# Patient Record
Sex: Male | Born: 1960
Health system: Southern US, Community
[De-identification: ages and names within clinical notes are randomized; demographics above are authoritative.]

## PROBLEM LIST (undated history)

## (undated) DIAGNOSIS — R51 Headache: Secondary | ICD-10-CM

## (undated) DIAGNOSIS — R079 Chest pain, unspecified: Secondary | ICD-10-CM

## (undated) DIAGNOSIS — R519 Headache, unspecified: Secondary | ICD-10-CM

## (undated) DIAGNOSIS — E785 Hyperlipidemia, unspecified: Secondary | ICD-10-CM

## (undated) DIAGNOSIS — D333 Benign neoplasm of cranial nerves: Secondary | ICD-10-CM

## (undated) DIAGNOSIS — N200 Calculus of kidney: Secondary | ICD-10-CM

## (undated) DIAGNOSIS — I82409 Acute embolism and thrombosis of unspecified deep veins of unspecified lower extremity: Secondary | ICD-10-CM

## (undated) DIAGNOSIS — I10 Essential (primary) hypertension: Secondary | ICD-10-CM

## (undated) HISTORY — DX: Headache, unspecified: R51.9

## (undated) HISTORY — DX: Chest pain, unspecified: R07.9

## (undated) HISTORY — DX: Essential (primary) hypertension: I10

## (undated) HISTORY — DX: Headache: R51

## (undated) HISTORY — DX: Hyperlipidemia, unspecified: E78.5

## (undated) HISTORY — DX: Calculus of kidney: N20.0

## (undated) HISTORY — DX: Benign neoplasm of cranial nerves: D33.3

---
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
## 1979-12-06 HISTORY — PX: CYSTOSCOPY: SHX5120

## 2000-03-02 ENCOUNTER — Encounter: Payer: Self-pay | Admitting: *Deleted

## 2000-03-02 ENCOUNTER — Encounter: Admission: RE | Admit: 2000-03-02 | Discharge: 2000-03-02 | Payer: Self-pay | Admitting: *Deleted

## 2002-12-05 HISTORY — PX: KNEE SURGERY: SHX244

## 2008-12-05 HISTORY — PX: COLONOSCOPY: SHX174

## 2009-03-05 HISTORY — PX: WRIST FRACTURE SURGERY: SHX121

## 2013-06-18 ENCOUNTER — Telehealth: Payer: Self-pay | Admitting: Cardiology

## 2013-06-18 NOTE — Telephone Encounter (Signed)
ROI faxed to [facility] at [facility] Ar [phone] 06/18/13/km

## 2013-07-15 ENCOUNTER — Telehealth: Payer: Self-pay | Admitting: Cardiology

## 2013-07-15 NOTE — Telephone Encounter (Signed)
ROI Refaxed to [facility] @ [place] [phone] 07/15/13/KM

## 2013-07-16 ENCOUNTER — Telehealth: Payer: Self-pay | Admitting: Cardiology

## 2013-07-16 NOTE — Telephone Encounter (Signed)
Records Were mailed in From [facility], Scheduling Dept Verified they have Records 07/16/13/KM

## 2013-07-17 ENCOUNTER — Encounter: Payer: Self-pay | Admitting: Cardiology

## 2013-07-17 ENCOUNTER — Ambulatory Visit (INDEPENDENT_AMBULATORY_CARE_PROVIDER_SITE_OTHER): Payer: Commercial Managed Care - PPO | Admitting: Cardiology

## 2013-07-17 VITALS — BP 118/68 | HR 55 | Ht 70.0 in | Wt 172.8 lb

## 2013-07-17 DIAGNOSIS — I10 Essential (primary) hypertension: Secondary | ICD-10-CM

## 2013-07-17 DIAGNOSIS — Z8249 Family history of ischemic heart disease and other diseases of the circulatory system: Secondary | ICD-10-CM

## 2013-07-17 DIAGNOSIS — E7800 Pure hypercholesterolemia, unspecified: Secondary | ICD-10-CM

## 2013-07-17 MED ORDER — ATORVASTATIN CALCIUM 10 MG PO TABS: 5.0000 mg | ORAL_TABLET | Freq: Every day | ORAL | Status: DC

## 2013-07-17 NOTE — Patient Instructions (Addendum)
Start lipitor 5 mg daily.  I recommend you have repeat lab work in 3 months.  We will schedule you for a stress test

## 2013-07-17 NOTE — Progress Notes (Signed)
Daniel Foster Date of Birth: 1961-11-20 Medical Record #[id number]  History of Present Illness: [name] is seen for evaluation today. 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 He has a history of hypertension and hypercholesterolemia. He also reports a family history of heart disease with his father having 2 heart attacks in his early 62s. He is just concerned about his family history and wants to be reevaluated. I saw [name] in 2002 and we performed a stress test at that time. He was able to exercise for 14 minutes on the Bruce protocol and had a normal stress test. He remains very active. He walks about 5 miles per day. He also bikes about 50-100 miles per week. He occasionally works out in [employer] He has had some minor chest pains that he attributes to costochondritis. It is sore to touch his chest and usually resolves within 2-3 days by taking a nonsteroidal drug. He admits that this past year he was not eating well and gaining 20 pounds. He has now lost that weight. He denies any dyspnea or palpitations.  No current outpatient prescriptions on file prior to visit.   No current facility-administered medications on file prior to visit.    No Known Allergies  Past Medical History  Diagnosis Date  . Chest pain   . Hypertension   . Headache   . Kidney stone     Passed X2 (last one 1981)  . Hyperlipidemia     Past Surgical History  Procedure Laterality Date  . Wrist fracture surgery  April 2010  . Tonsillectomy  1966  . Cystoscopy  1981  . Knee surgery  2004    Arthroscopic surgery Left knee for torn Meniscus    History  Smoking status  . Never Smoker   Smokeless tobacco  . Not on file    History  Alcohol Use: Not on file    Family History  Problem Relation Age of Onset  . Heart attack Father     Review of Systems: As noted in history of present illness.  All other systems were reviewed and are negative.  Physical Exam: BP 118/68  Pulse 55  Ht 5\' 10"  (1.778 m)  Wt 172  lb 12.8 oz (78.382 kg)  BMI 24.79 kg/m2 He is a pleasant white male in no acute distress. HEENT: Normal. Pupils equal round and reactive. Sclera clear. Oropharynx is clear. Neck is without JVD, adenopathy, thyromegaly, or bruits. Lungs: Clear Cardiovascular: Regular rate and rhythm, normal S1 and S2. No gallop or murmur. PMI is normal. No chest wall tenderness to palpation. Abdomen: Soft and nontender. No masses or hepatosplenomegaly. No bruits. Extremities: No edema. Pedal pulses are good. Neuro: Alert and oriented x3. Cranial nerves II through XII are intact. LABORATORY DATA: Laboratory data from his primary care was reviewed. LDL cholesterol has ranged from 119-141. ECG today demonstrates normal sinus rhythm rate 55 beats per minute. It is a normal ECG.  Assessment / Plan: 1. Hypertension-controlled 2. Hypercholesterolemia 3. Family history of coronary disease  Plan: The patient desires followup stress testing to assess his cardiac risk. We'll schedule him for a routine stress test. As far as risk factor modification his blood pressure is under good control. I recommended trying to get his LDL cholesterol less than 086. He has done well with dietary modification. I recommended a low dose of statin to try and achieve his goal. We'll start him on Lipitor 5 mg daily. I recommended he have repeat lab work in 3 months.

## 2013-07-18 ENCOUNTER — Telehealth: Payer: Self-pay | Admitting: Cardiology

## 2013-07-18 DIAGNOSIS — E7800 Pure hypercholesterolemia, unspecified: Secondary | ICD-10-CM

## 2013-07-18 NOTE — Telephone Encounter (Signed)
Patient called no answer.LMTC. 

## 2013-07-18 NOTE — Telephone Encounter (Signed)
New Problem    Pt wants to speak to someone in regards to the care plan suggestion.Marland Kitchen

## 2013-07-19 NOTE — Telephone Encounter (Signed)
Ok to get  Baseline lipid panel.  Peter Swaziland MD, [facility]

## 2013-07-19 NOTE — Telephone Encounter (Signed)
Spoke with patient who states he called yesterday to ask if Dr. Swaziland could order a lipid panel since he has been advised to start Lipitor 5 mg QD and previous lipid panel was 11/13.  Patient states after his ov he thought about the fact that that lipid panel was performed when he was at his heaviest weight and he would like a more recent baseline so that when he gets lipids reevaluated in 3 months they will have a better understanding of how well the Lipitor is working.  Patient states the first available date he is available for labs is 8/27 as he will be out of town until that time.  Patient would also like to ask that prescriptions be ordered for one year's supply so when Lipitor Rx is reordered to please order 365 pills (patient understands that there is additional cost associated with this). I advised patient that Dr. Swaziland is not in the office today but that I will send him a message asking if I can order the lab work.  Patient verbalized understanding and gratitude. Dr. Swaziland, can I order a fasting lipid panel for 8/27?

## 2013-07-19 NOTE — Telephone Encounter (Signed)
New Problem  Pt rtned call// upset at how late the call was made// insisted to to speak with a nurse instead of sending a msg/// Routed a call to Triage.

## 2013-07-22 NOTE — Telephone Encounter (Signed)
Message left on pt's identified voicemail that fasting lipid profile would be ordered for July 31, 2013 and that lab opens at 7:30 AM. Left message to call back if questions.

## 2013-07-22 NOTE — Addendum Note (Signed)
Addended byMeryl Crutch, Anita Mcadory L on: 07/22/2013 10:00 AM   Modules accepted: Orders

## 2013-07-31 ENCOUNTER — Other Ambulatory Visit (INDEPENDENT_AMBULATORY_CARE_PROVIDER_SITE_OTHER): Payer: Commercial Managed Care - PPO

## 2013-07-31 DIAGNOSIS — E7800 Pure hypercholesterolemia, unspecified: Secondary | ICD-10-CM

## 2013-07-31 LAB — LIPID PANEL
Cholesterol: 184 mg/dL (ref 0–200)
LDL Cholesterol: 128 mg/dL — ABNORMAL HIGH (ref 0–99)
Total CHOL/HDL Ratio: 5
VLDL: 19.6 mg/dL (ref 0.0–40.0)

## 2013-08-22 ENCOUNTER — Encounter: Payer: Self-pay | Admitting: Cardiology

## 2013-08-22 ENCOUNTER — Other Ambulatory Visit: Payer: Self-pay | Admitting: *Deleted

## 2013-08-22 ENCOUNTER — Ambulatory Visit (INDEPENDENT_AMBULATORY_CARE_PROVIDER_SITE_OTHER): Payer: Commercial Managed Care - PPO | Admitting: Physician Assistant

## 2013-08-22 DIAGNOSIS — Z8249 Family history of ischemic heart disease and other diseases of the circulatory system: Secondary | ICD-10-CM

## 2013-08-22 DIAGNOSIS — R079 Chest pain, unspecified: Secondary | ICD-10-CM

## 2013-08-22 NOTE — Progress Notes (Signed)
Exercise Treadmill Test  Pre-Exercise Testing Evaluation Rhythm: sinus bradycardia  Rate: 56     Test  Exercise Tolerance Test Ordering MD: Peter Swaziland, MD  Interpreting MD: Tereso Newcomer, PA-C  Unique Test No: 1  Treadmill:  1  Indication for ETT: chest discomfort, Family history of premature CAD  Contraindication to ETT: No   Stress Modality: exercise - treadmill  Cardiac Imaging Performed: non   Protocol: standard Bruce - maximal  Max BP:  199/70  Max MPHR (bpm):  166 85% MPR (bpm):  141  MPHR obtained (bpm):  169 % MPHR obtained:  102  Reached 85% MPHR (min:sec):  12:28 Total Exercise Time (min-sec):  15:00  Workload in METS:  17.2 Borg Scale: 17  Reason ETT Terminated:  desired heart rate attained    ST Segment Analysis At Rest: normal ST segments - no evidence of significant ST depression With Exercise: non-specific ST changes  Other Information Arrhythmia:  No Angina during ETT:  absent (0) Quality of ETT:  diagnostic  ETT Interpretation:  normal - no evidence of ischemia by ST analysis  Comments: Excellent exercise tolerance. No chest pain. Normal BP response to exercise. There is up-sloping ST depression.  No significant ST-T changes to suggest ischemia.  Recommendations: F/u with Dr. Peter Swaziland as directed. Signed,  Tereso Newcomer, PA-C   08/22/2013 9:52 AM

## 2013-10-10 ENCOUNTER — Other Ambulatory Visit: Payer: Self-pay

## 2013-10-17 ENCOUNTER — Other Ambulatory Visit: Payer: Commercial Managed Care - PPO

## 2014-09-19 ENCOUNTER — Other Ambulatory Visit: Payer: Self-pay

## 2016-06-13 ENCOUNTER — Telehealth: Payer: Self-pay | Admitting: Family Medicine

## 2016-06-13 NOTE — Telephone Encounter (Signed)
Pt's wife is an oncologist at [facility] and would like to know if you will accept him as a pt. Wife recommended you. Advised pt you were not accepting, but would ask. Pt does not mind seeing Tommi Rumps if you are too full.

## 2016-06-20 NOTE — Telephone Encounter (Signed)
I would be happy to see him thanks

## 2016-06-29 NOTE — Telephone Encounter (Signed)
Pt will be out of his bp med in the next few weeks, and would like to know if you could see him sooner than first available. Ok to work in?

## 2016-06-30 NOTE — Telephone Encounter (Signed)
Yes he can be worked in any time

## 2016-07-12 ENCOUNTER — Ambulatory Visit: Payer: Commercial Managed Care - PPO | Admitting: Adult Health

## 2016-07-12 ENCOUNTER — Ambulatory Visit (INDEPENDENT_AMBULATORY_CARE_PROVIDER_SITE_OTHER): Payer: Commercial Managed Care - PPO | Admitting: Family Medicine

## 2016-07-12 VITALS — BP 111/62 | HR 52 | Temp 98.2°F | Ht 69.5 in | Wt 177.0 lb

## 2016-07-12 DIAGNOSIS — E7800 Pure hypercholesterolemia, unspecified: Secondary | ICD-10-CM | POA: Diagnosis not present

## 2016-07-12 DIAGNOSIS — N401 Enlarged prostate with lower urinary tract symptoms: Secondary | ICD-10-CM | POA: Diagnosis not present

## 2016-07-12 DIAGNOSIS — Z8249 Family history of ischemic heart disease and other diseases of the circulatory system: Secondary | ICD-10-CM | POA: Diagnosis not present

## 2016-07-12 DIAGNOSIS — I10 Essential (primary) hypertension: Secondary | ICD-10-CM | POA: Diagnosis not present

## 2016-07-12 DIAGNOSIS — N138 Other obstructive and reflux uropathy: Secondary | ICD-10-CM

## 2016-07-12 LAB — CBC WITH DIFFERENTIAL/PLATELET
BASOS PCT: 0.4 % (ref 0.0–3.0)
Basophils Absolute: 0 10*3/uL (ref 0.0–0.1)
EOS ABS: 0.1 10*3/uL (ref 0.0–0.7)
EOS PCT: 1.2 % (ref 0.0–5.0)
HEMATOCRIT: 42.7 % (ref 39.0–52.0)
HEMOGLOBIN: 14.2 g/dL (ref 13.0–17.0)
LYMPHS PCT: 21.7 % (ref 12.0–46.0)
Lymphs Abs: 1.2 10*3/uL (ref 0.7–4.0)
MCHC: 33.3 g/dL (ref 30.0–36.0)
MCV: 80.9 fl (ref 78.0–100.0)
MONO ABS: 0.6 10*3/uL (ref 0.1–1.0)
Monocytes Relative: 11.2 % (ref 3.0–12.0)
NEUTROS ABS: 3.5 10*3/uL (ref 1.4–7.7)
Neutrophils Relative %: 65.5 % (ref 43.0–77.0)
PLATELETS: 201 10*3/uL (ref 150.0–400.0)
RBC: 5.28 Mil/uL (ref 4.22–5.81)
RDW: 16.1 % — AB (ref 11.5–15.5)
WBC: 5.4 10*3/uL (ref 4.0–10.5)

## 2016-07-12 LAB — POC URINALSYSI DIPSTICK (AUTOMATED)
BILIRUBIN UA: NEGATIVE
GLUCOSE UA: NEGATIVE
Ketones, UA: NEGATIVE
Leukocytes, UA: NEGATIVE
Nitrite, UA: NEGATIVE
Protein, UA: NEGATIVE
RBC UA: NEGATIVE
SPEC GRAV UA: 1.015
Urobilinogen, UA: 0.2
pH, UA: 7

## 2016-07-12 LAB — BASIC METABOLIC PANEL
BUN: 11 mg/dL (ref 6–23)
CALCIUM: 9.2 mg/dL (ref 8.4–10.5)
CHLORIDE: 104 meq/L (ref 96–112)
CO2: 29 meq/L (ref 19–32)
Creatinine, Ser: 1.11 mg/dL (ref 0.40–1.50)
GFR: 72.57 mL/min (ref >60.00)
Glucose, Bld: 82 mg/dL (ref 70–99)
Potassium: 4.6 mEq/L (ref 3.5–5.1)
SODIUM: 140 meq/L (ref 135–145)

## 2016-07-12 LAB — HEPATIC FUNCTION PANEL
ALBUMIN: 4.2 g/dL (ref 3.5–5.2)
ALK PHOS: 92 U/L (ref 39–117)
ALT: 17 U/L (ref 0–53)
AST: 24 U/L (ref 0–37)
BILIRUBIN DIRECT: 0.2 mg/dL (ref 0.0–0.3)
TOTAL PROTEIN: 6.5 g/dL (ref 6.0–8.3)
Total Bilirubin: 1.1 mg/dL (ref 0.2–1.2)

## 2016-07-12 LAB — LIPID PANEL
Cholesterol: 184 mg/dL (ref 0–200)
HDL: 38.7 mg/dL — AB (ref >39.00)
NONHDL: 145.6
Total CHOL/HDL Ratio: 5
Triglycerides: 212 mg/dL — ABNORMAL HIGH (ref 0.0–149.0)
VLDL: 42.4 mg/dL — ABNORMAL HIGH (ref 0.0–40.0)

## 2016-07-12 LAB — LDL CHOLESTEROL, DIRECT: Direct LDL: 117 mg/dL

## 2016-07-12 LAB — TSH: TSH: 1.73 u[IU]/mL (ref 0.35–4.50)

## 2016-07-12 LAB — PSA: PSA: 1.43 ng/mL (ref 0.10–4.00)

## 2016-07-12 MED ORDER — ATENOLOL 50 MG PO TABS: 50.0000 mg | ORAL_TABLET | Freq: Every day | ORAL | 0 refills | Status: DC

## 2016-07-12 NOTE — Progress Notes (Signed)
Pre visit review using our clinic review tool, if applicable. No additional management support is needed unless otherwise documented below in the visit note. 

## 2016-07-13 ENCOUNTER — Encounter: Payer: Self-pay | Admitting: Family Medicine

## 2016-07-13 NOTE — Progress Notes (Signed)
   Subjective:    Patient ID: Daniel Foster, male    DOB: 10/14/1961, 55 y.o.   MRN: [id number]  HPI 55 yr old male to establish with [place] after transfering from the care of Dr. Briscoe Deutscher. He feels well and has no concerns. He sees Dr. Peter Martinique regularly for HTN and elevated lipids, and these have been well controlled. 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    Review of Systems  Constitutional: Negative.   Respiratory: Negative.   Cardiovascular: Negative.   Gastrointestinal: Negative.   Neurological: Negative.        Objective:   Physical Exam  Constitutional: He is oriented to person, place, and time. He appears well-developed and well-nourished.  Neck: No thyromegaly present.  Cardiovascular: Normal rate, regular rhythm, normal heart sounds and intact distal pulses.   Pulmonary/Chest: Effort normal and breath sounds normal.  Lymphadenopathy:    He has no cervical adenopathy.  Neurological: He is alert and oriented to person, place, and time.          Assessment & Plan:  His HTN and hyperlipidemia are well controlled. We will locate his past medical records.  Laurey Morale, MD

## 2016-09-16 ENCOUNTER — Encounter: Payer: Self-pay | Admitting: Internal Medicine

## 2016-09-16 ENCOUNTER — Ambulatory Visit (INDEPENDENT_AMBULATORY_CARE_PROVIDER_SITE_OTHER): Payer: Commercial Managed Care - PPO | Admitting: Internal Medicine

## 2016-09-16 ENCOUNTER — Encounter (HOSPITAL_COMMUNITY): Payer: Self-pay | Admitting: Emergency Medicine

## 2016-09-16 ENCOUNTER — Emergency Department (HOSPITAL_COMMUNITY)
Admission: EM | Admit: 2016-09-16 | Discharge: 2016-09-16 | Disposition: A | Discharge status: Home / Self Care | Payer: Commercial Managed Care - PPO | Attending: Emergency Medicine | Admitting: Emergency Medicine

## 2016-09-16 ENCOUNTER — Ambulatory Visit (HOSPITAL_BASED_OUTPATIENT_CLINIC_OR_DEPARTMENT_OTHER)
Admission: RE | Admit: 2016-09-16 | Discharge: 2016-09-16 | Disposition: A | Discharge status: Home / Self Care | Payer: Commercial Managed Care - PPO | Source: Ambulatory Visit | Attending: Internal Medicine | Admitting: Internal Medicine

## 2016-09-16 VITALS — BP 130/72 | HR 58 | Temp 98.9°F | Wt 178.8 lb

## 2016-09-16 DIAGNOSIS — M79661 Pain in right lower leg: Secondary | ICD-10-CM

## 2016-09-16 DIAGNOSIS — M7989 Other specified soft tissue disorders: Secondary | ICD-10-CM

## 2016-09-16 DIAGNOSIS — I82411 Acute embolism and thrombosis of right femoral vein: Secondary | ICD-10-CM | POA: Insufficient documentation

## 2016-09-16 DIAGNOSIS — Z7982 Long term (current) use of aspirin: Secondary | ICD-10-CM | POA: Insufficient documentation

## 2016-09-16 DIAGNOSIS — Z7901 Long term (current) use of anticoagulants: Secondary | ICD-10-CM | POA: Diagnosis not present

## 2016-09-16 DIAGNOSIS — I10 Essential (primary) hypertension: Secondary | ICD-10-CM | POA: Insufficient documentation

## 2016-09-16 DIAGNOSIS — I82401 Acute embolism and thrombosis of unspecified deep veins of right lower extremity: Secondary | ICD-10-CM | POA: Diagnosis not present

## 2016-09-16 HISTORY — DX: Acute embolism and thrombosis of unspecified deep veins of unspecified lower extremity: I82.409

## 2016-09-16 LAB — CBC WITH DIFFERENTIAL/PLATELET
BASOS ABS: 0 10*3/uL (ref 0.0–0.1)
BASOS PCT: 0 %
EOS PCT: 2 %
Eosinophils Absolute: 0.2 10*3/uL (ref 0.0–0.7)
HCT: 41.7 % (ref 39.0–52.0)
Hemoglobin: 14 g/dL (ref 13.0–17.0)
LYMPHS PCT: 10 %
Lymphs Abs: 1 10*3/uL (ref 0.7–4.0)
MCH: 28.2 pg (ref 26.0–34.0)
MCHC: 33.6 g/dL (ref 30.0–36.0)
MCV: 84.1 fL (ref 78.0–100.0)
MONO ABS: 1.1 10*3/uL — AB (ref 0.1–1.0)
Monocytes Relative: 10 %
Neutro Abs: 8 10*3/uL — ABNORMAL HIGH (ref 1.7–7.7)
Neutrophils Relative %: 78 %
PLATELETS: 175 10*3/uL (ref 150–400)
RBC: 4.96 MIL/uL (ref 4.22–5.81)
RDW: 14.3 % (ref 11.5–15.5)
WBC: 10.3 10*3/uL (ref 4.0–10.5)

## 2016-09-16 LAB — COMPREHENSIVE METABOLIC PANEL
ALK PHOS: 93 U/L (ref 38–126)
ALT: 22 U/L (ref 17–63)
ANION GAP: 7 (ref 5–15)
AST: 26 U/L (ref 15–41)
Albumin: 4.1 g/dL (ref 3.5–5.0)
BILIRUBIN TOTAL: 1.2 mg/dL (ref 0.3–1.2)
BUN: 14 mg/dL (ref 6–20)
CALCIUM: 9.1 mg/dL (ref 8.9–10.3)
CO2: 27 mmol/L (ref 22–32)
Chloride: 107 mmol/L (ref 101–111)
Creatinine, Ser: 1.03 mg/dL (ref 0.61–1.24)
GFR calc Af Amer: >60 mL/min (ref >60)
Glucose, Bld: 86 mg/dL (ref 65–99)
POTASSIUM: 4 mmol/L (ref 3.5–5.1)
Sodium: 141 mmol/L (ref 135–145)
TOTAL PROTEIN: 7.5 g/dL (ref 6.5–8.1)

## 2016-09-16 LAB — ANTITHROMBIN III: ANTITHROMB III FUNC: 106 % (ref 75–120)

## 2016-09-16 LAB — PROTIME-INR
INR: 1.05
PROTHROMBIN TIME: 13.7 s (ref 11.4–15.2)

## 2016-09-16 MED ORDER — RIVAROXABAN 15 MG PO TABS
15.0000 mg | ORAL_TABLET | Freq: Once | ORAL | Status: AC
  Administered 2016-09-16: 15 mg via ORAL
  Filled 2016-09-16: qty 1

## 2016-09-16 MED ORDER — RIVAROXABAN (XARELTO) VTE STARTER PACK (15 & 20 MG): ORAL_TABLET | ORAL | 0 refills | Status: DC

## 2016-09-16 NOTE — Progress Notes (Signed)
Chief Complaint  Patient presents with  . Rt Calf Swelling and Pain    HPI: Daniel Foster 55 y.o. comes in PCP not available. Onset this week of right leg discomfort calf intermittent swelling and pain with cramping. No history of blood clots did have travel to [place] last month. No bleeding no acute knee pain. Generally healthy on antihypertensive medication. He is physically active. No history of injury to the right leg.  ROS: See pertinent positives and negatives per HPI. No cp sob   Past Medical History:  Diagnosis Date  . Chest pain   . DVT (deep venous thrombosis) ([place])   . Headache   . Hyperlipidemia   . Hypertension   . Kidney stone    Passed X2 (last one 44)    Family History  Problem Relation Age of Onset  . Heart attack Father     Social History   Social History  . Marital status: Married    Spouse name: N/A  . Number of children: N/A  . Years of education: N/A   Social History Main Topics  . Smoking status: Never Smoker  . Smokeless tobacco: Never Used  . Alcohol use None  . Drug use: Unknown  . Sexual activity: Not Asked   Other Topics Concern  . None   Social History Narrative  . None    Outpatient Medications Prior to Visit  Medication Sig Dispense Refill  . atenolol (TENORMIN) 50 MG tablet Take 1 tablet (50 mg total) by mouth daily. 365 tablet 0  . Multiple Vitamin (MULTIVITAMIN) tablet Take 1 tablet by mouth daily.     No facility-administered medications prior to visit.      EXAM:  BP 130/72 (BP Location: Right Arm, Patient Position: Sitting, Cuff Size: Normal)   Pulse (!) 58   Temp 98.9 F (37.2 C) (Oral)   Wt 178 lb 12.8 oz (81.1 kg)   SpO2 97%   BMI 26.03 kg/m   Body mass index is 26.03 kg/m.  GENERAL: vitals reviewed and listed above, alert, oriented, appears well hydrated and in no acute distress HEENT: atraumatic, conjunctiva  clear, no obvious abnormalities on inspection of external nose and ears  NECK: no  obvious masses on inspection palpation  LUNGS: clear to auscultation bilaterally, no wheezes, rales or rhonchi, good air movement CV: HRRR, no clubbing cyanosis o  MS: moves all extremities Right lower extremity fairly tense calf edema compared to the last veins do collapse on elevation pulses intact neurovascular appears normal. Gait appears normal. Knee without acute swelling. PSYCH: pleasant and cooperative, no obvious depression or anxiety Lab Results  Component Value Date   WBC 5.4 07/12/2016   HGB 14.2 07/12/2016   HCT 42.7 07/12/2016   PLT 201.0 07/12/2016   GLUCOSE 82 07/12/2016   CHOL 184 07/12/2016   TRIG 212.0 (H) 07/12/2016   HDL 38.70 (L) 07/12/2016   LDLDIRECT 117.0 07/12/2016   LDLCALC 128 (H) 07/31/2013   ALT 17 07/12/2016   AST 24 07/12/2016   NA 140 07/12/2016   K 4.6 07/12/2016   CL 104 07/12/2016   CREATININE 1.11 07/12/2016   BUN 11 07/12/2016   CO2 29 07/12/2016   TSH 1.73 07/12/2016   PSA 1.43 07/12/2016    ASSESSMENT AND PLAN:  Discussed the following assessment and plan:  Pain and swelling of right lower leg - Plan: VAS [place] LOWER EXTREMITY VENOUS (DVT) consider dvt      Expectant management. Disc and fu . Last labs  done  August   -Patient advised to return or notify health care team  if symptoms worsen ,persist or new concerns arise.  Patient Instructions  Will elt you know results of doppler and then plan rx fu  Labs from august show nl renal function and no anemia     Mariann Laster K. Panosh M.D.

## 2016-09-16 NOTE — ED Provider Notes (Signed)
[facility] DEPT Provider Note   CSN: [id number] Arrival date & time: 09/16/16  1242     History   Chief Complaint Chief Complaint  Patient presents with  . DVT    HPI Daniel Foster is a 55 y.o. male.  HPI Patient was sent in after a positive outpatient Doppler for DVT. His had some swelling for the last 2 days. His Right Leg. No Chest Pain or Trouble Breathing. No Previous DVT. he works out. He had some swelling in the leg and some pain in the calf and thigh. He does not smoke cigarettes. He did around 3 weeks ago have a flight to [place]. Otherwise he recently drove around 2 hours to [place]. No family history of DVTs. No bleeding. Past Medical History:  Diagnosis Date  . Chest pain   . DVT (deep venous thrombosis) ([place])   . Headache   . Hyperlipidemia   . Hypertension   . Kidney stone    Passed X2 (last one 1981)    Patient Active Problem List   Diagnosis Date Noted  . HTN (hypertension) 07/17/2013  . Hypercholesterolemia 07/17/2013  . Family history of premature coronary heart disease 07/17/2013    Past Surgical History:  Procedure Laterality Date  . COLONOSCOPY  2010   per Dr. Paulita Fujita, clear, repeat in 10 yrs   . CYSTOSCOPY  1981  . KNEE SURGERY  2004   Arthroscopic surgery Left knee for torn Meniscus  . TONSILLECTOMY  1966  . WRIST FRACTURE SURGERY  April 2010       Home Medications    Prior to Admission medications   Medication Sig Start Date End Date Taking? Authorizing Provider  Ascorbic Acid (VITAMIN C) 1000 MG tablet Take 1,000 mg by mouth daily as needed (immune support).   Yes Historical Provider, MD  aspirin EC 81 MG tablet Take 81 mg by mouth daily.   Yes Historical Provider, MD  atenolol (TENORMIN) 50 MG tablet Take 1 tablet (50 mg total) by mouth daily. 07/12/16  Yes Laurey Morale, MD  ibuprofen (ADVIL,MOTRIN) 200 MG tablet Take 200 mg by mouth 2 (two) times daily as needed for headache or moderate pain.   Yes Historical Provider, MD    Multiple Vitamin (MULTIVITAMIN) tablet Take 1 tablet by mouth daily.   Yes Historical Provider, MD  Rivaroxaban 15 & 20 MG TBPK Take as directed on package: Start with one 15mg  tablet by mouth twice a day with food. On Day 22, switch to one 20mg  tablet once a day with food. 09/16/16   Davonna Belling, MD    Family History Family History  Problem Relation Age of Onset  . Heart attack Father     Social History Social History  Substance Use Topics  . Smoking status: Never Smoker  . Smokeless tobacco: Never Used  . Alcohol use Not on file     Allergies   Review of patient's allergies indicates no known allergies.   Review of Systems Review of Systems  Constitutional: Negative for appetite change.  HENT: Negative for congestion.   Respiratory: Negative for shortness of breath.   Cardiovascular: Positive for leg swelling. Negative for chest pain.  Gastrointestinal: Negative for abdominal pain.  Genitourinary: Negative for flank pain.  Musculoskeletal: Negative for back pain.  Neurological: Negative for light-headedness.  Hematological: Negative for adenopathy.  Psychiatric/Behavioral: Negative for confusion.     Physical Exam Updated Vital Signs BP 126/81   Pulse (!) 58   Temp 98.7 F (37.1 C) (Oral)  Resp 16   SpO2 97%   Physical Exam  Constitutional: He appears well-developed.  Eyes: EOM are normal.  Cardiovascular: Normal rate.   Pulmonary/Chest: Effort normal.  Abdominal: Soft.  Musculoskeletal: He exhibits edema and tenderness.  some swelling of right thigh and right calf. Pulse intact. Edema. No color change.  Neurological: He is alert.  Skin: Skin is warm. Capillary refill takes less than 2 seconds.     ED Treatments / Results  Labs (all labs ordered are listed, but only abnormal results are displayed) Labs Reviewed  CBC WITH DIFFERENTIAL/PLATELET - Abnormal; Notable for the following:       Result Value   Neutro Abs 8.0 (*)    Monocytes  Absolute 1.1 (*)    All other components within normal limits  COMPREHENSIVE METABOLIC PANEL  PROTIME-INR  ANTITHROMBIN III  PROTEIN C ACTIVITY  PROTEIN C, TOTAL  PROTEIN S ACTIVITY  PROTEIN S, TOTAL  LUPUS ANTICOAGULANT PANEL  BETA-2-GLYCOPROTEIN I ABS, IGG/M/A  HOMOCYSTEINE  FACTOR 5 LEIDEN  PROTHROMBIN GENE MUTATION  CARDIOLIPIN ANTIBODIES, IGG, IGM, IGA    EKG  EKG Interpretation None       Radiology No results found.  Procedures Procedures (including critical care time)  Medications Ordered in ED Medications  Rivaroxaban (XARELTO) tablet 15 mg (not administered)     Initial Impression / Assessment and Plan / ED Course  I have reviewed the triage vital signs and the nursing notes.  Pertinent labs & imaging results that were available during my care of the patient were reviewed by me and considered in my medical decision making (see chart for details).  Clinical Course    Patient presents with swelling in his right leg. Had it for the last couple days. Found to have DVT. Doubt pulmonary embolism. No chest pain. No shortness of breath. Resting pulse rate of 58, although he is on a beta blocker. He was able to walk a couple miles today without difficulty. Discuss with vascular surgery PA and with Dr. Marin Olp. Will start Xarelto and will have follow-up with PCP  Final Clinical Impressions(s) / ED Diagnoses   Final diagnoses:  Acute thromboembolism of deep veins of right lower extremity (HCC)    New Prescriptions New Prescriptions   RIVAROXABAN 15 & 20 MG TBPK    Take as directed on package: Start with one 15mg  tablet by mouth twice a day with food. On Day 22, switch to one 20mg  tablet once a day with food.     Davonna Belling, MD 09/16/16 1524

## 2016-09-16 NOTE — Progress Notes (Addendum)
*  PRELIMINARY RESULTS* Vascular Ultrasound Right lower extremity venous duplex has been completed.  Preliminary findings: DVT noted in the right mid to distal femoral vein, popliteal vein, gastroc veins, posterior tibial veins, and peroneal veins. Thrombus in the right mid femoral appears mobile.   12:14 pm Attempted call report to number given. Waited on hold until 12:35 pm. Per protocol, patient was taken to ED for evaluation since I could not get an answer at Dr. Velora Mediate office.    Landry Mellow, RDMS, RVT  09/16/2016

## 2016-09-16 NOTE — ED Notes (Signed)
Bed: [facility] Expected date:  Expected time:  Means of arrival:  Comments: 

## 2016-09-16 NOTE — Patient Instructions (Signed)
Will elt you know results of doppler and then plan rx fu  Labs from august show nl renal function and no anemia

## 2016-09-16 NOTE — ED Triage Notes (Signed)
Per pt, states right leg pain since Tuesday-had [place] today-has loose femoral clot-sent here for further eval-patient takes an aspirin a day-no history of clots-no respiratory distress, no injury to leg

## 2016-09-16 NOTE — Progress Notes (Signed)
Pre visit review using our clinic review tool, if applicable. No additional management support is needed unless otherwise documented below in the visit note. 

## 2016-09-17 LAB — BETA-2-GLYCOPROTEIN I ABS, IGG/M/A
Beta-2 Glyco I IgG: <9 GPI IgG units (ref 0–20)
Beta-2-Glycoprotein I IgA: <9 GPI IgA units (ref 0–25)
Beta-2-Glycoprotein I IgM: <9 GPI IgM units (ref 0–32)

## 2016-09-19 ENCOUNTER — Telehealth: Payer: Self-pay | Admitting: Family Medicine

## 2016-09-19 LAB — HOMOCYSTEINE: HOMOCYSTEINE-NORM: 9.8 umol/L (ref 0.0–15.0)

## 2016-09-19 LAB — CARDIOLIPIN ANTIBODIES, IGG, IGM, IGA
Anticardiolipin IgA: <9 APL U/mL (ref 0–11)
Anticardiolipin IgM: <9 MPL U/mL (ref 0–12)

## 2016-09-19 LAB — PROTEIN C, TOTAL: Protein C, Total: 86 % (ref 60–150)

## 2016-09-19 NOTE — Telephone Encounter (Signed)
Refill request for Xarelto 20 mg take 1 po qd and sned to [facility].

## 2016-09-20 LAB — FACTOR 5 LEIDEN

## 2016-09-20 LAB — LUPUS ANTICOAGULANT PANEL
DRVVT: 45.7 s (ref 0.0–47.0)
PTT LA: 39.6 s (ref 0.0–51.9)

## 2016-09-20 LAB — PROTEIN S, TOTAL: PROTEIN S AG TOTAL: 96 % (ref 60–150)

## 2016-09-20 LAB — PROTEIN S ACTIVITY: PROTEIN S ACTIVITY: 73 % (ref 63–140)

## 2016-09-20 LAB — PROTEIN C ACTIVITY: Protein C Activity: 114 % (ref 73–180)

## 2016-09-20 MED ORDER — RIVAROXABAN 20 MG PO TABS: 20.0000 mg | ORAL_TABLET | Freq: Every day | ORAL | 0 refills | Status: DC

## 2016-09-20 NOTE — Telephone Encounter (Signed)
I [facility] to [facility], spoke with pt. Also per Dr. Sarajane Jews pt should stop Aspirin, pt agreed.

## 2016-09-20 NOTE — Telephone Encounter (Signed)
Call in Xarelto 20 mg to take daily, #30 with no rf. He will need to see me OV sometime soon

## 2016-09-21 LAB — PROTHROMBIN GENE MUTATION

## 2016-09-27 ENCOUNTER — Encounter: Payer: Self-pay | Admitting: Family Medicine

## 2016-09-27 ENCOUNTER — Ambulatory Visit (INDEPENDENT_AMBULATORY_CARE_PROVIDER_SITE_OTHER): Payer: Commercial Managed Care - PPO | Admitting: Family Medicine

## 2016-09-27 VITALS — BP 108/63 | HR 59 | Temp 98.2°F | Ht 69.5 in | Wt 179.0 lb

## 2016-09-27 DIAGNOSIS — I82411 Acute embolism and thrombosis of right femoral vein: Secondary | ICD-10-CM | POA: Diagnosis not present

## 2016-09-27 MED ORDER — RIVAROXABAN 20 MG PO TABS: 20.0000 mg | ORAL_TABLET | Freq: Every day | ORAL | 4 refills | Status: DC

## 2016-09-27 NOTE — Progress Notes (Signed)
Pre visit review using our clinic review tool, if applicable. No additional management support is needed unless otherwise documented below in the visit note. 

## 2016-09-27 NOTE — Progress Notes (Signed)
   Subjective:    Patient ID: Daniel Foster, male    DOB: [birth year], 55 y.o.   MRN: [id number]  HPI Here to follow up on a right leg DVT. He was seen on 09-16-16 for 2 weeks of swelling and pain in the right calf after a plane flight to [place] and several extended car rides. No chest pain or SOB. He went to the ER and had a doppler showing an extensive DVT from the right middle femoral vein to the right gastrocnemius and tibial veins. He even had a complete hypercoagulability workup there which was unremarkable. He was started on Xarelto to take 15 mg bid for 21 days and then switch to 20 mg daily. Today he feels fine. The swelling has resolved and he has only a tinge of pain at times. He actually raked leaves in his yard a few days ago and did fine.    Review of Systems  Constitutional: Negative.   Respiratory: Negative.   Cardiovascular: Negative.   Musculoskeletal: Positive for myalgias.       Objective:   Physical Exam  Constitutional: He appears well-developed. No distress.  Cardiovascular: Normal rate, regular rhythm, normal heart sounds and intact distal pulses.   Pulmonary/Chest: Effort normal and breath sounds normal.  Musculoskeletal:  Right leg is normal with no tenderness or swelling or cords felt           Assessment & Plan:  Right leg DVT responding well to treatment with Xarelto. We will plan on his taking this for 6 months. At that time we will get another doppler to follow up.  Laurey Morale, MD

## 2016-09-29 ENCOUNTER — Encounter: Payer: Self-pay | Admitting: Family Medicine

## 2016-10-20 ENCOUNTER — Other Ambulatory Visit: Payer: Self-pay | Admitting: Family Medicine

## 2016-10-20 MED ORDER — RIVAROXABAN 20 MG PO TABS: 20.0000 mg | ORAL_TABLET | Freq: Every day | ORAL | 4 refills | Status: DC

## 2016-10-20 MED ORDER — RIVAROXABAN 20 MG PO TABS: 20.0000 mg | ORAL_TABLET | Freq: Every day | ORAL | 1 refills | Status: DC

## 2016-10-20 MED FILL — XARELTO 20 MG TABLET: 20 | 30 days supply | Qty: 30 | Fill #0

## 2016-10-20 NOTE — Telephone Encounter (Signed)
Pt request refill  rivaroxaban (XARELTO) 20 MG TABS tablet  [facility], but wants to switch to  [facility] and would like a 90 day

## 2016-10-20 NOTE — Telephone Encounter (Signed)
Pt notified Rx was sent to [facility] as requested.

## 2016-11-24 MED FILL — XARELTO 20 MG TABLET: 20 | 30 days supply | Qty: 30 | Fill #1

## 2016-12-22 MED FILL — XARELTO 20 MG TABLET: 20 | 30 days supply | Qty: 30 | Fill #2

## 2017-01-16 MED FILL — XARELTO 20 MG TABLET: 20 | 30 days supply | Qty: 30 | Fill #3

## 2017-02-08 MED FILL — XARELTO 20 MG TABLET: 20 | 30 days supply | Qty: 30 | Fill #4

## 2017-03-14 MED FILL — XARELTO 20 MG TABLET: 20 | 30 days supply | Qty: 30 | Fill #5

## 2017-04-25 ENCOUNTER — Ambulatory Visit (INDEPENDENT_AMBULATORY_CARE_PROVIDER_SITE_OTHER): Payer: Commercial Managed Care - PPO | Admitting: Family Medicine

## 2017-04-25 ENCOUNTER — Encounter: Payer: Self-pay | Admitting: Family Medicine

## 2017-04-25 VITALS — BP 118/80 | HR 56 | Temp 97.8°F | Wt 185.4 lb

## 2017-04-25 DIAGNOSIS — I10 Essential (primary) hypertension: Secondary | ICD-10-CM

## 2017-04-25 DIAGNOSIS — I82411 Acute embolism and thrombosis of right femoral vein: Secondary | ICD-10-CM | POA: Diagnosis not present

## 2017-04-25 MED ORDER — RIVAROXABAN 20 MG PO TABS: 20.0000 mg | ORAL_TABLET | Freq: Every day | ORAL | 1 refills | Status: DC

## 2017-04-25 NOTE — Patient Instructions (Signed)
WE NOW OFFER   [facility] FAST TRACK!!!  SAME DAY Appointments for ACUTE CARE  Such as: Sprains, Injuries, cuts, abrasions, rashes, muscle pain, joint pain, back pain Colds, flu, sore throats, headache, allergies, cough, fever  Ear pain, sinus and eye infections Abdominal pain, nausea, vomiting, diarrhea, upset stomach Animal/insect bites  3 Easy Ways to Schedule: Walk-In Scheduling Call in scheduling Mychart Sign-up: [email or web address]         

## 2017-04-25 NOTE — Progress Notes (Signed)
   Subjective:    Patient ID: Jonluke Cobbins, male    DOB: 1960-12-10, 56 y.o.   MRN: [id number]  HPI Here to follow up on a right lower leg DVT. This was seen on 09-16-16 and he has been on Xarelto since then. He feels great. There is no swelling or pain in the leg. We had agreed to recheck this in 6 months.    Review of Systems  Constitutional: Negative.   Respiratory: Negative.   Cardiovascular: Negative.   Neurological: Negative.        Objective:   Physical Exam  Constitutional: He appears well-developed and well-nourished.  Cardiovascular: Normal rate, regular rhythm, normal heart sounds and intact distal pulses.   Pulmonary/Chest: Effort normal and breath sounds normal.  Musculoskeletal:  Right leg shows no swelling or tenderness. Bevelyn Buckles is negative.           Assessment & Plan:  Right lower leg DVT. We will set up another venous doppler soon. He and his wife would like for him to stay on Xarelto through this August to protect him during several long car trips and several plane flights, and I agree that this would be a good plan. He will return in late August for labs and a wellness exam.  Alysia Penna, MD

## 2017-05-09 ENCOUNTER — Other Ambulatory Visit: Payer: Self-pay | Admitting: Family Medicine

## 2017-05-11 ENCOUNTER — Ambulatory Visit (HOSPITAL_COMMUNITY)
Admission: RE | Admit: 2017-05-11 | Discharge: 2017-05-11 | Disposition: A | Discharge status: Home / Self Care | Payer: Commercial Managed Care - PPO | Source: Ambulatory Visit | Attending: Cardiovascular Disease | Admitting: Cardiovascular Disease

## 2017-05-11 DIAGNOSIS — I82411 Acute embolism and thrombosis of right femoral vein: Secondary | ICD-10-CM

## 2017-05-11 DIAGNOSIS — E785 Hyperlipidemia, unspecified: Secondary | ICD-10-CM | POA: Diagnosis not present

## 2017-05-11 DIAGNOSIS — Z7901 Long term (current) use of anticoagulants: Secondary | ICD-10-CM | POA: Diagnosis not present

## 2017-05-11 DIAGNOSIS — I10 Essential (primary) hypertension: Secondary | ICD-10-CM | POA: Diagnosis not present

## 2017-05-11 DIAGNOSIS — I82531 Chronic embolism and thrombosis of right popliteal vein: Secondary | ICD-10-CM | POA: Diagnosis not present

## 2017-05-15 MED FILL — XARELTO 20 MG TABLET: 20 | 30 days supply | Qty: 30 | Fill #0

## 2017-06-06 MED FILL — XARELTO 20 MG TABLET: 20 | 30 days supply | Qty: 30 | Fill #1

## 2017-07-03 ENCOUNTER — Telehealth: Payer: Self-pay | Admitting: Family Medicine

## 2017-07-03 DIAGNOSIS — I10 Essential (primary) hypertension: Secondary | ICD-10-CM

## 2017-07-03 MED ORDER — ATENOLOL 50 MG PO TABS: 50.0000 mg | ORAL_TABLET | Freq: Every day | ORAL | 0 refills | Status: DC

## 2017-07-03 NOTE — Telephone Encounter (Signed)
Pt request refill  atenolol (TENORMIN) 50 MG tablet  Pt has CPE on 8/30 but will be out of his med in a few days. 30 day if ok.  Cook, [place]

## 2017-07-03 NOTE — Telephone Encounter (Signed)
I sent script e-scribe and left a voice message.

## 2017-07-10 MED FILL — XARELTO 20 MG TABLET: 20 | 30 days supply | Qty: 30 | Fill #2

## 2017-08-03 ENCOUNTER — Ambulatory Visit (INDEPENDENT_AMBULATORY_CARE_PROVIDER_SITE_OTHER): Payer: Commercial Managed Care - PPO | Admitting: Family Medicine

## 2017-08-03 ENCOUNTER — Encounter: Payer: Self-pay | Admitting: Family Medicine

## 2017-08-03 VITALS — BP 110/66 | HR 50 | Ht 69.5 in | Wt 183.0 lb

## 2017-08-03 DIAGNOSIS — Z0000 Encounter for general adult medical examination without abnormal findings: Secondary | ICD-10-CM

## 2017-08-03 DIAGNOSIS — I10 Essential (primary) hypertension: Secondary | ICD-10-CM

## 2017-08-03 LAB — LIPID PANEL
CHOLESTEROL: 190 mg/dL (ref 0–200)
HDL: 39.3 mg/dL (ref >39.00)
LDL CALC: 127 mg/dL — AB (ref 0–99)
NonHDL: 150.56
TRIGLYCERIDES: 116 mg/dL (ref 0.0–149.0)
Total CHOL/HDL Ratio: 5
VLDL: 23.2 mg/dL (ref 0.0–40.0)

## 2017-08-03 LAB — CBC WITH DIFFERENTIAL/PLATELET
BASOS PCT: 0.6 % (ref 0.0–3.0)
Basophils Absolute: 0 10*3/uL (ref 0.0–0.1)
EOS ABS: 0.1 10*3/uL (ref 0.0–0.7)
EOS PCT: 2 % (ref 0.0–5.0)
HCT: 44 % (ref 39.0–52.0)
HEMOGLOBIN: 15 g/dL (ref 13.0–17.0)
Lymphocytes Relative: 25.8 % (ref 12.0–46.0)
Lymphs Abs: 1 10*3/uL (ref 0.7–4.0)
MCHC: 34 g/dL (ref 30.0–36.0)
MCV: 87.8 fl (ref 78.0–100.0)
MONO ABS: 0.6 10*3/uL (ref 0.1–1.0)
Monocytes Relative: 15.9 % — ABNORMAL HIGH (ref 3.0–12.0)
NEUTROS ABS: 2.2 10*3/uL (ref 1.4–7.7)
Neutrophils Relative %: 55.7 % (ref 43.0–77.0)
PLATELETS: 199 10*3/uL (ref 150.0–400.0)
RBC: 5.01 Mil/uL (ref 4.22–5.81)
RDW: 13.3 % (ref 11.5–15.5)
WBC: 4 10*3/uL (ref 4.0–10.5)

## 2017-08-03 LAB — POC URINALSYSI DIPSTICK (AUTOMATED)
BILIRUBIN UA: NEGATIVE
GLUCOSE UA: NEGATIVE
Ketones, UA: NEGATIVE
Leukocytes, UA: NEGATIVE
Nitrite, UA: NEGATIVE
SPEC GRAV UA: 1.025 (ref 1.010–1.025)
Urobilinogen, UA: 0.2 E.U./dL
pH, UA: 5.5 (ref 5.0–8.0)

## 2017-08-03 LAB — HEPATIC FUNCTION PANEL
ALBUMIN: 4.2 g/dL (ref 3.5–5.2)
ALK PHOS: 79 U/L (ref 39–117)
ALT: 15 U/L (ref 0–53)
AST: 22 U/L (ref 0–37)
BILIRUBIN DIRECT: 0.2 mg/dL (ref 0.0–0.3)
TOTAL PROTEIN: 6.5 g/dL (ref 6.0–8.3)
Total Bilirubin: 1.2 mg/dL (ref 0.2–1.2)

## 2017-08-03 LAB — PSA: PSA: 1.05 ng/mL (ref 0.10–4.00)

## 2017-08-03 LAB — BASIC METABOLIC PANEL
BUN: 16 mg/dL (ref 6–23)
CHLORIDE: 104 meq/L (ref 96–112)
CO2: 29 mEq/L (ref 19–32)
Calcium: 9.2 mg/dL (ref 8.4–10.5)
Creatinine, Ser: 1.2 mg/dL (ref 0.40–1.50)
GFR: 66.08 mL/min (ref >60.00)
GLUCOSE: 83 mg/dL (ref 70–99)
POTASSIUM: 4.1 meq/L (ref 3.5–5.1)
Sodium: 141 mEq/L (ref 135–145)

## 2017-08-03 LAB — TSH: TSH: 1.98 u[IU]/mL (ref 0.35–4.50)

## 2017-08-03 MED ORDER — ATENOLOL 50 MG PO TABS: 50.0000 mg | ORAL_TABLET | Freq: Every day | ORAL | 0 refills | Status: DC

## 2017-08-03 NOTE — Progress Notes (Signed)
   Subjective:    Patient ID: Daniel Foster, male    DOB: 07-Jun-1961, 56 y.o.   MRN: [id number]  HPI Here for a well exam. He feels great. 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 He had an [place] in June showing some resolution of the thrombus. We plan to repeat a doppler in December.    Review of Systems  Constitutional: Negative.   HENT: Negative.   Eyes: Negative.   Respiratory: Negative.   Cardiovascular: Negative.   Gastrointestinal: Negative.   Genitourinary: Negative.   Musculoskeletal: Negative.   Skin: Negative.   Neurological: Negative.   Psychiatric/Behavioral: Negative.        Objective:   Physical Exam  Constitutional: He is oriented to person, place, and time. He appears well-developed and well-nourished. No distress.  HENT:  Head: Normocephalic and atraumatic.  Right Ear: External ear normal.  Left Ear: External ear normal.  Nose: Nose normal.  Mouth/Throat: Oropharynx is clear and moist. No oropharyngeal exudate.  Eyes: Pupils are equal, round, and reactive to light. Conjunctivae and EOM are normal. Right eye exhibits no discharge. Left eye exhibits no discharge. No scleral icterus.  Neck: Neck supple. No JVD present. No tracheal deviation present. No thyromegaly present.  Cardiovascular: Normal rate, regular rhythm, normal heart sounds and intact distal pulses.  Exam reveals no gallop and no friction rub.   No murmur heard. Pulmonary/Chest: Effort normal and breath sounds normal. No respiratory distress. He has no wheezes. He has no rales. He exhibits no tenderness.  Abdominal: Soft. Bowel sounds are normal. He exhibits no distension and no mass. There is no tenderness. There is no rebound and no guarding.  Genitourinary: Rectum normal, prostate normal and penis normal. Rectal exam shows guaiac negative stool. No penile tenderness.  Musculoskeletal: Normal range of motion. He exhibits no edema or tenderness.  Lymphadenopathy:    He has no  cervical adenopathy.  Neurological: He is alert and oriented to person, place, and time. He has normal reflexes. No cranial nerve deficit. He exhibits normal muscle tone. Coordination normal.  Skin: Skin is warm and dry. No rash noted. He is not diaphoretic. No erythema. No pallor.  Psychiatric: He has a normal mood and affect. His behavior is normal. Judgment and thought content normal.          Assessment & Plan:  Well exam. We discussed diet and exercise. Get fasting labs.  Alysia Penna, MD

## 2017-08-03 NOTE — Patient Instructions (Signed)
WE NOW OFFER   [facility] FAST TRACK!!!  SAME DAY Appointments for ACUTE CARE  Such as: Sprains, Injuries, cuts, abrasions, rashes, muscle pain, joint pain, back pain Colds, flu, sore throats, headache, allergies, cough, fever  Ear pain, sinus and eye infections Abdominal pain, nausea, vomiting, diarrhea, upset stomach Animal/insect bites  3 Easy Ways to Schedule: Walk-In Scheduling Call in scheduling Mychart Sign-up: [email or web address]         

## 2017-08-08 ENCOUNTER — Telehealth: Payer: Self-pay | Admitting: Family Medicine

## 2017-08-08 MED FILL — XARELTO 20 MG TABLET: 20 | 30 days supply | Qty: 30 | Fill #3

## 2017-08-08 NOTE — Telephone Encounter (Signed)
FYI  Pt will pick up bp med at [facility] this time. Pt has a new pharm [facility] outpt pharm. Please update the system

## 2017-08-08 NOTE — Telephone Encounter (Signed)
I updated pharmacy in chart.

## 2017-08-24 ENCOUNTER — Encounter: Payer: Self-pay | Admitting: Family Medicine

## 2017-09-19 MED FILL — XARELTO 20 MG TABLET: 20 | 30 days supply | Qty: 30 | Fill #4

## 2017-10-13 MED FILL — XARELTO 20 MG TABLET: 20 | 30 days supply | Qty: 30 | Fill #5

## 2018-06-26 DIAGNOSIS — M25561 Pain in right knee: Secondary | ICD-10-CM | POA: Diagnosis not present

## 2018-08-29 ENCOUNTER — Encounter: Payer: Commercial Managed Care - PPO | Admitting: Family Medicine

## 2018-09-05 ENCOUNTER — Encounter: Payer: Self-pay | Admitting: Family Medicine

## 2018-09-05 ENCOUNTER — Ambulatory Visit (INDEPENDENT_AMBULATORY_CARE_PROVIDER_SITE_OTHER): Payer: BLUE CROSS/BLUE SHIELD | Admitting: Family Medicine

## 2018-09-05 VITALS — BP 124/80 | HR 55 | Temp 98.4°F | Wt 179.6 lb

## 2018-09-05 DIAGNOSIS — Z23 Encounter for immunization: Secondary | ICD-10-CM | POA: Diagnosis not present

## 2018-09-05 DIAGNOSIS — I10 Essential (primary) hypertension: Secondary | ICD-10-CM | POA: Diagnosis not present

## 2018-09-05 DIAGNOSIS — Z0000 Encounter for general adult medical examination without abnormal findings: Secondary | ICD-10-CM

## 2018-09-05 MED ORDER — ATENOLOL 50 MG PO TABS: 50.0000 mg | ORAL_TABLET | Freq: Every day | ORAL | 0 refills | Status: DC

## 2018-09-05 MED ORDER — RIVAROXABAN 20 MG PO TABS: 20.0000 mg | ORAL_TABLET | Freq: Every day | ORAL | 1 refills | Status: DC

## 2018-09-05 MED FILL — XARELTO 20 MG TABLET: 20 | 90 days supply | Qty: 90 | Fill #0

## 2018-09-05 MED FILL — ATENOLOL 50 MG TABLET: 50 | 90 days supply | Qty: 90 | Fill #0

## 2018-09-05 NOTE — Progress Notes (Signed)
   Subjective:    Patient ID: Daniel Foster, male    DOB: 1960/12/27, 57 y.o.   MRN: [id number]  HPI Here for a well exam. He feels fine. He does ask about getting back on Xarelto for awhile. He had a DVT in 2017 and he took Xarelto for about a year. He has done well since then, but lately he has been doing a lot of driving. His mother in [place] passed away recently and he is driving to [place] 2 or 3 times a week to take care of her estate. He is concerned about possible risk of recurrent DVT. He is currently not working, but he stays busy and he works out at [employer] several days a week.    Review of Systems  Constitutional: Negative.   HENT: Negative.   Eyes: Negative.   Respiratory: Negative.   Cardiovascular: Negative.   Gastrointestinal: Negative.   Genitourinary: Negative.   Musculoskeletal: Negative.   Skin: Negative.   Neurological: Negative.   Psychiatric/Behavioral: Negative.        Objective:   Physical Exam  Constitutional: He is oriented to person, place, and time. He appears well-developed and well-nourished. No distress.  HENT:  Head: Normocephalic and atraumatic.  Right Ear: External ear normal.  Left Ear: External ear normal.  Nose: Nose normal.  Mouth/Throat: Oropharynx is clear and moist. No oropharyngeal exudate.  Eyes: Pupils are equal, round, and reactive to light. Conjunctivae and EOM are normal. Right eye exhibits no discharge. Left eye exhibits no discharge. No scleral icterus.  Neck: Neck supple. No JVD present. No tracheal deviation present. No thyromegaly present.  Cardiovascular: Normal rate, regular rhythm, normal heart sounds and intact distal pulses. Exam reveals no gallop and no friction rub.  No murmur heard. Pulmonary/Chest: Effort normal and breath sounds normal. No respiratory distress. He has no wheezes. He has no rales. He exhibits no tenderness.  Abdominal: Soft. Bowel sounds are normal. He exhibits no distension and no mass. There is  no tenderness. There is no rebound and no guarding.  Genitourinary: Rectum normal, prostate normal and penis normal. Rectal exam shows guaiac negative stool. No penile tenderness.  Musculoskeletal: Normal range of motion. He exhibits no edema or tenderness.  Lymphadenopathy:    He has no cervical adenopathy.  Neurological: He is alert and oriented to person, place, and time. He has normal reflexes. He displays normal reflexes. No cranial nerve deficit. He exhibits normal muscle tone. Coordination normal.  Skin: Skin is warm and dry. No rash noted. He is not diaphoretic. No erythema. No pallor.  Psychiatric: He has a normal mood and affect. His behavior is normal. Judgment and thought content normal.          Assessment & Plan:  Well exam. We discussed diet and exercise. Get fasting labs. He will start back on Xarelto 20 mg daily for 6 months. Alysia Penna, MD

## 2018-09-06 LAB — CBC WITH DIFFERENTIAL/PLATELET
BASOS ABS: 0 10*3/uL (ref 0.0–0.1)
Basophils Relative: 0.5 % (ref 0.0–3.0)
EOS ABS: 0.1 10*3/uL (ref 0.0–0.7)
Eosinophils Relative: 1.4 % (ref 0.0–5.0)
HEMATOCRIT: 45.8 % (ref 39.0–52.0)
Hemoglobin: 15.3 g/dL (ref 13.0–17.0)
LYMPHS PCT: 21.1 % (ref 12.0–46.0)
Lymphs Abs: 0.8 10*3/uL (ref 0.7–4.0)
MCHC: 33.5 g/dL (ref 30.0–36.0)
MCV: 88.3 fl (ref 78.0–100.0)
Monocytes Absolute: 0.6 10*3/uL (ref 0.1–1.0)
Monocytes Relative: 17.7 % — ABNORMAL HIGH (ref 3.0–12.0)
Neutro Abs: 2.1 10*3/uL (ref 1.4–7.7)
Neutrophils Relative %: 59.3 % (ref 43.0–77.0)
PLATELETS: 189 10*3/uL (ref 150.0–400.0)
RBC: 5.19 Mil/uL (ref 4.22–5.81)
RDW: 12.8 % (ref 11.5–15.5)
WBC: 3.6 10*3/uL — AB (ref 4.0–10.5)

## 2018-09-06 LAB — BASIC METABOLIC PANEL
BUN: 12 mg/dL (ref 6–23)
CHLORIDE: 107 meq/L (ref 96–112)
CO2: 29 meq/L (ref 19–32)
CREATININE: 1.13 mg/dL (ref 0.40–1.50)
Calcium: 9 mg/dL (ref 8.4–10.5)
GFR: 70.55 mL/min (ref >60.00)
Glucose, Bld: 81 mg/dL (ref 70–99)
Potassium: 4.2 mEq/L (ref 3.5–5.1)
SODIUM: 141 meq/L (ref 135–145)

## 2018-09-06 LAB — LIPID PANEL
CHOL/HDL RATIO: 4
Cholesterol: 164 mg/dL (ref 0–200)
HDL: 44.3 mg/dL (ref >39.00)
LDL CALC: 107 mg/dL — AB (ref 0–99)
NonHDL: 120
TRIGLYCERIDES: 63 mg/dL (ref 0.0–149.0)
VLDL: 12.6 mg/dL (ref 0.0–40.0)

## 2018-09-06 LAB — POC URINALSYSI DIPSTICK (AUTOMATED)
Bilirubin, UA: NEGATIVE
GLUCOSE UA: NEGATIVE
Ketones, UA: NEGATIVE
Leukocytes, UA: NEGATIVE
NITRITE UA: NEGATIVE
Protein, UA: NEGATIVE
RBC UA: NEGATIVE
Spec Grav, UA: 1.02 (ref 1.010–1.025)
UROBILINOGEN UA: 0.2 U/dL
pH, UA: 6 (ref 5.0–8.0)

## 2018-09-06 LAB — HEPATIC FUNCTION PANEL
ALT: 20 U/L (ref 0–53)
AST: 26 U/L (ref 0–37)
Albumin: 4.1 g/dL (ref 3.5–5.2)
Alkaline Phosphatase: 71 U/L (ref 39–117)
BILIRUBIN TOTAL: 1.3 mg/dL — AB (ref 0.2–1.2)
Bilirubin, Direct: 0.2 mg/dL (ref 0.0–0.3)
Total Protein: 6.2 g/dL (ref 6.0–8.3)

## 2018-09-06 LAB — TSH: TSH: 1.72 u[IU]/mL (ref 0.35–4.50)

## 2018-09-06 LAB — PSA: PSA: 1.87 ng/mL (ref 0.10–4.00)

## 2018-09-06 NOTE — Addendum Note (Signed)
Addended by: Elmer Picker on: 09/06/2018 08:41 AM   Modules accepted: Orders

## 2018-09-12 ENCOUNTER — Encounter: Payer: Self-pay | Admitting: *Deleted

## 2018-11-22 MED FILL — XARELTO 20 MG TABLET: 20 | 90 days supply | Qty: 90 | Fill #1

## 2018-11-22 MED FILL — ATENOLOL 50 MG TABLET: 50 | 90 days supply | Qty: 90 | Fill #1

## 2019-01-28 DIAGNOSIS — H0100B Unspecified blepharitis left eye, upper and lower eyelids: Secondary | ICD-10-CM | POA: Diagnosis not present

## 2019-01-28 DIAGNOSIS — H524 Presbyopia: Secondary | ICD-10-CM | POA: Diagnosis not present

## 2019-01-28 DIAGNOSIS — H0100A Unspecified blepharitis right eye, upper and lower eyelids: Secondary | ICD-10-CM | POA: Diagnosis not present

## 2019-01-28 DIAGNOSIS — H43813 Vitreous degeneration, bilateral: Secondary | ICD-10-CM | POA: Diagnosis not present

## 2019-02-27 ENCOUNTER — Other Ambulatory Visit: Payer: Self-pay | Admitting: Family Medicine

## 2019-02-27 MED FILL — ATENOLOL 50 MG TABLET: 50 | 90 days supply | Qty: 90 | Fill #2

## 2019-06-03 MED FILL — ATENOLOL 50 MG TABLET: 50 | 90 days supply | Qty: 90 | Fill #3

## 2020-01-18 ENCOUNTER — Ambulatory Visit: Payer: BLUE CROSS/BLUE SHIELD

## 2020-05-26 ENCOUNTER — Other Ambulatory Visit: Payer: Self-pay

## 2020-05-27 ENCOUNTER — Ambulatory Visit (INDEPENDENT_AMBULATORY_CARE_PROVIDER_SITE_OTHER): Payer: BC Managed Care – PPO | Admitting: Family Medicine

## 2020-05-27 ENCOUNTER — Encounter: Payer: Self-pay | Admitting: Family Medicine

## 2020-05-27 VITALS — BP 118/62 | HR 56 | Temp 97.9°F | Wt 182.0 lb

## 2020-05-27 DIAGNOSIS — Z125 Encounter for screening for malignant neoplasm of prostate: Secondary | ICD-10-CM | POA: Diagnosis not present

## 2020-05-27 DIAGNOSIS — Z0000 Encounter for general adult medical examination without abnormal findings: Secondary | ICD-10-CM | POA: Diagnosis not present

## 2020-05-27 DIAGNOSIS — I10 Essential (primary) hypertension: Secondary | ICD-10-CM | POA: Diagnosis not present

## 2020-05-27 LAB — LIPID PANEL
Cholesterol: 156 mg/dL (ref 0–200)
HDL: 35.6 mg/dL — ABNORMAL LOW (ref >39.00)
LDL Cholesterol: 104 mg/dL — ABNORMAL HIGH (ref 0–99)
NonHDL: 119.92
Total CHOL/HDL Ratio: 4
Triglycerides: 82 mg/dL (ref 0.0–149.0)
VLDL: 16.4 mg/dL (ref 0.0–40.0)

## 2020-05-27 LAB — BASIC METABOLIC PANEL
BUN: 15 mg/dL (ref 6–23)
CO2: 29 mEq/L (ref 19–32)
Calcium: 8.9 mg/dL (ref 8.4–10.5)
Chloride: 106 mEq/L (ref 96–112)
Creatinine, Ser: 1.09 mg/dL (ref 0.40–1.50)
GFR: 68.8 mL/min (ref >60.00)
Glucose, Bld: 94 mg/dL (ref 70–99)
Potassium: 4.1 mEq/L (ref 3.5–5.1)
Sodium: 140 mEq/L (ref 135–145)

## 2020-05-27 LAB — CBC WITH DIFFERENTIAL/PLATELET
Basophils Absolute: 0 10*3/uL (ref 0.0–0.1)
Basophils Relative: 0.6 % (ref 0.0–3.0)
Eosinophils Absolute: 0.1 10*3/uL (ref 0.0–0.7)
Eosinophils Relative: 1.9 % (ref 0.0–5.0)
HCT: 41.5 % (ref 39.0–52.0)
Hemoglobin: 14.4 g/dL (ref 13.0–17.0)
Lymphocytes Relative: 20 % (ref 12.0–46.0)
Lymphs Abs: 0.8 10*3/uL (ref 0.7–4.0)
MCHC: 34.6 g/dL (ref 30.0–36.0)
MCV: 87.6 fl (ref 78.0–100.0)
Monocytes Absolute: 0.6 10*3/uL (ref 0.1–1.0)
Monocytes Relative: 13 % — ABNORMAL HIGH (ref 3.0–12.0)
Neutro Abs: 2.7 10*3/uL (ref 1.4–7.7)
Neutrophils Relative %: 64.5 % (ref 43.0–77.0)
Platelets: 192 10*3/uL (ref 150.0–400.0)
RBC: 4.73 Mil/uL (ref 4.22–5.81)
RDW: 12.6 % (ref 11.5–15.5)
WBC: 4.2 10*3/uL (ref 4.0–10.5)

## 2020-05-27 LAB — PSA: PSA: 1.56 ng/mL (ref 0.10–4.00)

## 2020-05-27 LAB — HEPATIC FUNCTION PANEL
ALT: 16 U/L (ref 0–53)
AST: 19 U/L (ref 0–37)
Albumin: 4 g/dL (ref 3.5–5.2)
Alkaline Phosphatase: 77 U/L (ref 39–117)
Bilirubin, Direct: 0.2 mg/dL (ref 0.0–0.3)
Total Bilirubin: 1.1 mg/dL (ref 0.2–1.2)
Total Protein: 5.9 g/dL — ABNORMAL LOW (ref 6.0–8.3)

## 2020-05-27 LAB — TSH: TSH: 1.53 u[IU]/mL (ref 0.35–4.50)

## 2020-05-27 MED ORDER — RIVAROXABAN 20 MG PO TABS: ORAL_TABLET | ORAL | 1 refills | Status: DC

## 2020-05-27 MED ORDER — ATENOLOL 50 MG PO TABS: 50.0000 mg | ORAL_TABLET | Freq: Every day | ORAL | 0 refills | Status: DC

## 2020-05-27 MED FILL — XARELTO 20 MG TABLET: 20 | 90 days supply | Qty: 90 | Fill #0

## 2020-05-27 MED FILL — ATENOLOL 50 MG TABLET: 50 | 365 days supply | Qty: 365 | Fill #0

## 2020-05-27 NOTE — Progress Notes (Signed)
   Subjective:    Patient ID: Daniel Foster, male    DOB: [birth year], 59 y.o.   MRN: [id number]  HPI Here for a well exam. He feels great. 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    Review of Systems  Constitutional: Negative.   HENT: Negative.   Eyes: Negative.   Respiratory: Negative.   Cardiovascular: Negative.   Gastrointestinal: Negative.   Genitourinary: Negative.   Musculoskeletal: Negative.   Skin: Negative.   Neurological: Negative.   Psychiatric/Behavioral: Negative.        Objective:   Physical Exam Constitutional:      General: He is not in acute distress.    Appearance: He is well-developed. He is not diaphoretic.  HENT:     Head: Normocephalic and atraumatic.     Right Ear: External ear normal.     Left Ear: External ear normal.     Nose: Nose normal.     Mouth/Throat:     Pharynx: No oropharyngeal exudate.  Eyes:     General: No scleral icterus.       Right eye: No discharge.        Left eye: No discharge.     Conjunctiva/sclera: Conjunctivae normal.     Pupils: Pupils are equal, round, and reactive to light.  Neck:     Thyroid: No thyromegaly.     Vascular: No JVD.     Trachea: No tracheal deviation.  Cardiovascular:     Rate and Rhythm: Normal rate and regular rhythm.     Heart sounds: Normal heart sounds. No murmur heard.  No friction rub. No gallop.   Pulmonary:     Effort: Pulmonary effort is normal. No respiratory distress.     Breath sounds: Normal breath sounds. No wheezing or rales.  Chest:     Chest wall: No tenderness.  Abdominal:     General: Bowel sounds are normal. There is no distension.     Palpations: Abdomen is soft. There is no mass.     Tenderness: There is no abdominal tenderness. There is no guarding or rebound.  Genitourinary:    Penis: Normal. No tenderness.      Testes: Normal.     Prostate: Normal.     Rectum: Normal. Guaiac result negative.  Musculoskeletal:        General: No  tenderness. Normal range of motion.     Cervical back: Neck supple.  Lymphadenopathy:     Cervical: No cervical adenopathy.  Skin:    General: Skin is warm and dry.     Coloration: Skin is not pale.     Findings: No erythema or rash.  Neurological:     Mental Status: He is alert and oriented to person, place, and time.     Cranial Nerves: No cranial nerve deficit.     Motor: No abnormal muscle tone.     Coordination: Coordination normal.     Deep Tendon Reflexes: Reflexes are normal and symmetric. Reflexes normal.  Psychiatric:        Behavior: Behavior normal.        Thought Content: Thought content normal.        Judgment: Judgment normal.           Assessment & Plan:  Well exam. We discussed diet and exercise. Get fasting labs. Set up another colonoscopy.  Alysia Penna, MD

## 2020-07-22 ENCOUNTER — Telehealth: Payer: Self-pay | Admitting: Family Medicine

## 2020-07-22 NOTE — Telephone Encounter (Signed)
Pt dropped off a form to be filled out. Pt would like a call at [phone] when completed.  Form placed in red folder.  Pt aware of 3-5 business day and possible fee

## 2020-07-23 NOTE — Telephone Encounter (Signed)
The form is ready, in your folder

## 2020-07-24 NOTE — Telephone Encounter (Signed)
form has been faxed.

## 2020-07-31 ENCOUNTER — Telehealth: Payer: Self-pay

## 2020-07-31 NOTE — Telephone Encounter (Signed)
Medical evaluation form has been completed and placed up front for pick up. Spoke with the patient and he is aware.

## 2020-08-28 MED FILL — XARELTO 20 MG TABLET: 20 | 90 days supply | Qty: 90 | Fill #1

## 2020-09-08 ENCOUNTER — Other Ambulatory Visit: Payer: Self-pay

## 2020-09-09 ENCOUNTER — Other Ambulatory Visit: Payer: Self-pay

## 2020-09-09 ENCOUNTER — Encounter: Payer: Self-pay | Admitting: Family Medicine

## 2020-09-09 ENCOUNTER — Ambulatory Visit: Payer: BC Managed Care – PPO | Admitting: Family Medicine

## 2020-09-09 VITALS — BP 110/70 | HR 55 | Temp 98.3°F | Ht 70.0 in | Wt 185.6 lb

## 2020-09-09 DIAGNOSIS — Z23 Encounter for immunization: Secondary | ICD-10-CM

## 2020-09-09 DIAGNOSIS — H9313 Tinnitus, bilateral: Secondary | ICD-10-CM | POA: Diagnosis not present

## 2020-09-09 DIAGNOSIS — H6981 Other specified disorders of Eustachian tube, right ear: Secondary | ICD-10-CM | POA: Diagnosis not present

## 2020-09-09 NOTE — Progress Notes (Signed)
   Subjective:    Patient ID: Daniel Foster, male    DOB: 1961-01-03, 59 y.o.   MRN: [id number]  HPI Here for several weeks of intermittent fullness and decreased hearing in the right ear. He has had tinnitus in both ears for years. No ear pain or dizziness.    Review of Systems  Constitutional: Negative.   HENT: Positive for hearing loss and postnasal drip. Negative for congestion, ear discharge and ear pain.   Eyes: Negative.   Respiratory: Negative.        Objective:   Physical Exam Constitutional:      Appearance: Normal appearance.  HENT:     Right Ear: Tympanic membrane, ear canal and external ear normal. There is no impacted cerumen.     Left Ear: Tympanic membrane, ear canal and external ear normal. There is no impacted cerumen.     Nose: No congestion.     Mouth/Throat:     Pharynx: Oropharynx is clear.  Eyes:     Conjunctiva/sclera: Conjunctivae normal.  Cardiovascular:     Rate and Rhythm: Normal rate and regular rhythm.     Pulses: Normal pulses.     Heart sounds: Normal heart sounds.  Pulmonary:     Effort: Pulmonary effort is normal.     Breath sounds: Normal breath sounds.  Lymphadenopathy:     Cervical: No cervical adenopathy.  Neurological:     General: No focal deficit present.     Mental Status: He is alert and oriented to person, place, and time.     Cranial Nerves: No cranial nerve deficit.           Assessment & Plan:  Eustachian tube dysfunction. Try Claritin and Flonase daily. Recheck prn. Alysia Penna, MD

## 2020-09-09 NOTE — Addendum Note (Signed)
Addended by: Matilde Sprang on: 09/09/2020 02:39 PM   Modules accepted: Orders

## 2020-11-12 ENCOUNTER — Telehealth: Payer: Self-pay

## 2020-11-12 NOTE — Telephone Encounter (Signed)
Received fax from [facility] stated that the office has contacted the patient on two separate occasions and he didn't respond to message. [facility] phone is [phone]

## 2021-01-27 DIAGNOSIS — H52203 Unspecified astigmatism, bilateral: Secondary | ICD-10-CM | POA: Diagnosis not present

## 2021-01-27 DIAGNOSIS — H2513 Age-related nuclear cataract, bilateral: Secondary | ICD-10-CM | POA: Diagnosis not present

## 2021-01-27 DIAGNOSIS — H0100A Unspecified blepharitis right eye, upper and lower eyelids: Secondary | ICD-10-CM | POA: Diagnosis not present

## 2021-01-27 DIAGNOSIS — H43813 Vitreous degeneration, bilateral: Secondary | ICD-10-CM | POA: Diagnosis not present

## 2021-02-15 ENCOUNTER — Telehealth: Payer: Self-pay | Admitting: Family Medicine

## 2021-02-15 NOTE — Telephone Encounter (Signed)
Ok to place referral.

## 2021-02-15 NOTE — Telephone Encounter (Signed)
Patient is requesting a referral to ENT based on his last appointment regarding his right ear.  He states he was told by Dr. Sarajane Jews that he could get a referral if it has not cleared up.  Please advise.

## 2021-02-16 NOTE — Addendum Note (Signed)
Addended by: Alysia Penna A on: 02/16/2021 08:53 AM   Modules accepted: Orders

## 2021-02-16 NOTE — Telephone Encounter (Signed)
The referral was done  

## 2021-02-16 NOTE — Telephone Encounter (Signed)
Spoke with pt verbalized that referral to ENT was placed and he should get a call for scheduling

## 2021-03-10 ENCOUNTER — Other Ambulatory Visit: Payer: Self-pay

## 2021-03-10 ENCOUNTER — Ambulatory Visit (INDEPENDENT_AMBULATORY_CARE_PROVIDER_SITE_OTHER): Payer: BC Managed Care – PPO | Admitting: Otolaryngology

## 2021-03-10 ENCOUNTER — Encounter (INDEPENDENT_AMBULATORY_CARE_PROVIDER_SITE_OTHER): Payer: Self-pay | Admitting: Otolaryngology

## 2021-03-10 VITALS — Temp 96.8°F

## 2021-03-10 DIAGNOSIS — H903 Sensorineural hearing loss, bilateral: Secondary | ICD-10-CM | POA: Diagnosis not present

## 2021-03-10 DIAGNOSIS — H9041 Sensorineural hearing loss, unilateral, right ear, with unrestricted hearing on the contralateral side: Secondary | ICD-10-CM | POA: Diagnosis not present

## 2021-03-10 DIAGNOSIS — H9313 Tinnitus, bilateral: Secondary | ICD-10-CM | POA: Diagnosis not present

## 2021-03-10 NOTE — Progress Notes (Signed)
HPI: Daniel Foster is a 60 y.o. male who presents is referred by his PCP Dr. Sarajane Jews for evaluation of right ear complaints.  Apparently his right ear symptoms began a couple months ago after he had a fan blowing on his right ear for several hours.  Since that time he has had a noise in the ear and has had pressure sensation in the ear.  He feels like he is on a plane and has noticed some decreased hearing in the right ear.  He feels like he has pressure in the ear.  He is able to "pop" the ear with slightly better hearing temporarily.  He has been tried a nasal steroid spray for eustachian tube dysfunction..  Past Medical History:  Diagnosis Date  . Chest pain   . DVT (deep venous thrombosis) ([place])   . Headache   . Hyperlipidemia   . Hypertension   . Kidney stone    Passed X2 (last one 1981)   Past Surgical History:  Procedure Laterality Date  . COLONOSCOPY  2010   per Dr. Paulita Fujita, clear, repeat in 10 yrs   . CYSTOSCOPY  1981  . KNEE SURGERY  2004   Arthroscopic surgery Left knee for torn Meniscus  . TONSILLECTOMY  1966  . WRIST FRACTURE SURGERY  April 2010   Social History   Socioeconomic History  . Marital status: Married    Spouse name: Not on file  . Number of children: Not on file  . Years of education: Not on file  . Highest education level: Not on file  Occupational History  . Not on file  Tobacco Use  . Smoking status: Never Smoker  . Smokeless tobacco: Never Used  Substance and Sexual Activity  . Alcohol use: Not on file  . Drug use: Not on file  . Sexual activity: Not on file  Other Topics Concern  . Not on file  Social History Narrative  . Not on file   Social Determinants of Health   Financial Resource Strain: Not on file  Food Insecurity: Not on file  Transportation Needs: Not on file  Physical Activity: Not on file  Stress: Not on file  Social Connections: Not on file   Family History  Problem Relation Age of Onset  . Heart attack Father    No  Known Allergies Prior to Admission medications   Medication Sig Start Date End Date Taking? Authorizing Provider  atenolol (TENORMIN) 50 MG tablet Take 1 tablet (50 mg total) by mouth daily. 05/27/20   Laurey Morale, MD  rivaroxaban (XARELTO) 20 MG TABS tablet TAKE 1 TABLET BY MOUTH DAILY WITH SUPPER. Patient taking differently: Take 20 mg by mouth as needed (With traveling). TAKE 1 TABLET BY MOUTH DAILY WITH SUPPER. 05/27/20   Laurey Morale, MD     Positive ROS: Otherwise negative  All other systems have been reviewed and were otherwise negative with the exception of those mentioned in the HPI and as above.  Physical Exam: Constitutional: Alert, well-appearing, no acute distress Ears: External ears without lesions or tenderness. Ear canals are clear bilaterally.  TMs are clear bilaterally with good mobility on pneumatic otoscopy and no middle ear abnormality noted. Nasal: External nose without lesions. Septum is deviated to the right with mild rhinitis and clear nasal passages otherwise with no signs of infection..  Oral: Lips and gums without lesions. Tongue and palate mucosa without lesions. Posterior oropharynx clear. Neck: No palpable adenopathy or masses Respiratory: Breathing comfortably  Skin:  No facial/neck lesions or rash noted.  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  However in the lower frequencies his right ear is hearing approximately 30 dB sounds in the left ear is hearing approximately 15-20 dB sounds.  He had type A tympanograms bilaterally.  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  Asymmetric hearing on recent audiogram  Plan: We will plan on scheduling patient for an MRI scan of the temporal bone to rule out retrocochlear pathology  causing the asymmetric hearing loss.   Radene Journey, MD   CC:

## 2021-03-12 ENCOUNTER — Other Ambulatory Visit (INDEPENDENT_AMBULATORY_CARE_PROVIDER_SITE_OTHER): Payer: Self-pay

## 2021-03-12 DIAGNOSIS — H9041 Sensorineural hearing loss, unilateral, right ear, with unrestricted hearing on the contralateral side: Secondary | ICD-10-CM

## 2021-03-19 ENCOUNTER — Encounter (INDEPENDENT_AMBULATORY_CARE_PROVIDER_SITE_OTHER): Payer: Self-pay

## 2021-03-26 ENCOUNTER — Telehealth (INDEPENDENT_AMBULATORY_CARE_PROVIDER_SITE_OTHER): Payer: Self-pay | Admitting: Otolaryngology

## 2021-03-26 NOTE — Telephone Encounter (Signed)
Patient called today concerning canceling the MRI scan that was scheduled to evaluate right ear sensorineural hearing loss for evaluation of retrocochlear pathology. Patient feels like the pressure in the ear comes and goes and he can change it with put his finger in the ear.  He inquires about possibly placing a myringotomy tube in the ear. I discussed with him that his ear canals and TMs were clear and that he had no middle ear space abnormality noted.  The hearing test he had performed in our office demonstrated a sensorineural hearing loss in the right ear compared to the left and that he had type A tympanograms bilaterally.  His word discrimination scores were also slightly worse on the right side.  This is suggestive of a cochlear or retrocochlear etiology of his hearing loss.  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  He would like to wait on scheduling the MRI scan.  I recommended obtaining repeat audiologic testing in 6 months and if his hearing loss progresses consider obtaining MRI scan at that time.  Reviewed with him that these are generally benign  tumors but can cause progressive hearing loss and sometimes balance problems. He states that he will call back to schedule a hearing test in 6 months. We will cancel MRI scan at his request.

## 2021-03-31 ENCOUNTER — Other Ambulatory Visit: Payer: BC Managed Care – PPO

## 2021-04-01 ENCOUNTER — Other Ambulatory Visit: Payer: BC Managed Care – PPO

## 2021-06-14 ENCOUNTER — Other Ambulatory Visit: Payer: Self-pay

## 2021-06-14 ENCOUNTER — Ambulatory Visit: Payer: BC Managed Care – PPO | Admitting: Family Medicine

## 2021-06-14 ENCOUNTER — Encounter: Payer: Self-pay | Admitting: Family Medicine

## 2021-06-14 VITALS — BP 110/78 | HR 53 | Temp 97.7°F | Wt 179.2 lb

## 2021-06-14 DIAGNOSIS — R109 Unspecified abdominal pain: Secondary | ICD-10-CM | POA: Diagnosis not present

## 2021-06-14 DIAGNOSIS — N200 Calculus of kidney: Secondary | ICD-10-CM | POA: Diagnosis not present

## 2021-06-14 LAB — POC URINALSYSI DIPSTICK (AUTOMATED)
Bilirubin, UA: NEGATIVE
Blood, UA: NEGATIVE
Glucose, UA: NEGATIVE
Ketones, UA: NEGATIVE
Leukocytes, UA: NEGATIVE
Nitrite, UA: NEGATIVE
Protein, UA: NEGATIVE
Spec Grav, UA: <=1.005 — AB (ref 1.010–1.025)
Urobilinogen, UA: 0.2 E.U./dL
pH, UA: 6 (ref 5.0–8.0)

## 2021-06-14 NOTE — Progress Notes (Signed)
   Subjective:    Patient ID: Daniel Foster, male    DOB: 1960/12/25, 60 y.o.   MRN: [id number]  HPI Here for a possible kidney stone. He had 2 stones in 1981 when he lived in [place]. He was able to pass one at that time and the other one was removed with a basket. He has done well since then until yesterday. Yesterday afternoon he developed a sharp knife-like pain in the left flank as well as some nausea. No urinary urgency or fever. He took aspirin and drank a lot of water. The pain lessened a bit so he could go to sleep. This morning he feels fine and is urinating freely. He never saw blood in the urine. Normally he drinks a lot of diet [facility] every day and very little water. His UA today is clear.    Review of Systems  Constitutional: Negative.   Respiratory: Negative.    Cardiovascular: Negative.   Gastrointestinal:  Positive for nausea. Negative for abdominal distention, abdominal pain, anal bleeding, blood in stool, constipation, diarrhea, rectal pain and vomiting.  Genitourinary:  Positive for flank pain. Negative for difficulty urinating, dysuria, frequency, hematuria and urgency.      Objective:   Physical Exam Constitutional:      General: He is not in acute distress.    Appearance: Normal appearance.  Cardiovascular:     Rate and Rhythm: Normal rate and regular rhythm.     Pulses: Normal pulses.     Heart sounds: Normal heart sounds.  Pulmonary:     Effort: Pulmonary effort is normal.     Breath sounds: Normal breath sounds.  Abdominal:     General: Abdomen is flat. There is no distension.     Palpations: Abdomen is soft. There is no mass.     Tenderness: There is no abdominal tenderness. There is no right CVA tenderness, guarding or rebound.     Hernia: No hernia is present.     Comments: He has very slight CVA tenderness on the left   Neurological:     Mental Status: He is alert.          Assessment & Plan:  I think it is likely that he had a kidney  stone and was able to pass it. I advised him to drink less soda and to drink a lot more water every day. We will refer him to Urology to get established. Alysia Penna, MD

## 2021-06-15 ENCOUNTER — Ambulatory Visit: Payer: BC Managed Care – PPO | Admitting: Family Medicine

## 2021-07-27 DIAGNOSIS — Z1211 Encounter for screening for malignant neoplasm of colon: Secondary | ICD-10-CM | POA: Diagnosis not present

## 2021-07-27 DIAGNOSIS — R140 Abdominal distension (gaseous): Secondary | ICD-10-CM | POA: Diagnosis not present

## 2021-08-16 DIAGNOSIS — N200 Calculus of kidney: Secondary | ICD-10-CM | POA: Diagnosis not present

## 2021-08-25 DIAGNOSIS — N200 Calculus of kidney: Secondary | ICD-10-CM | POA: Diagnosis not present

## 2021-08-30 ENCOUNTER — Other Ambulatory Visit (INDEPENDENT_AMBULATORY_CARE_PROVIDER_SITE_OTHER): Payer: Self-pay | Admitting: Otolaryngology

## 2021-08-30 DIAGNOSIS — H9121 Sudden idiopathic hearing loss, right ear: Secondary | ICD-10-CM

## 2021-09-13 ENCOUNTER — Other Ambulatory Visit (HOSPITAL_COMMUNITY): Payer: Self-pay

## 2021-09-13 MED ORDER — TRAMADOL HCL 50 MG PO TABS
ORAL_TABLET | ORAL | 0 refills | Status: DC
  Filled 2021-09-13: qty 30, 4d supply, fill #0

## 2021-09-22 ENCOUNTER — Ambulatory Visit
Admission: RE | Admit: 2021-09-22 | Discharge: 2021-09-22 | Disposition: A | Discharge status: Home / Self Care | Payer: BC Managed Care – PPO | Source: Ambulatory Visit | Attending: Otolaryngology | Admitting: Otolaryngology

## 2021-09-22 ENCOUNTER — Other Ambulatory Visit: Payer: Self-pay

## 2021-09-22 DIAGNOSIS — I639 Cerebral infarction, unspecified: Secondary | ICD-10-CM | POA: Diagnosis not present

## 2021-09-22 DIAGNOSIS — G9389 Other specified disorders of brain: Secondary | ICD-10-CM | POA: Diagnosis not present

## 2021-09-22 DIAGNOSIS — H9190 Unspecified hearing loss, unspecified ear: Secondary | ICD-10-CM | POA: Diagnosis not present

## 2021-09-22 DIAGNOSIS — J3489 Other specified disorders of nose and nasal sinuses: Secondary | ICD-10-CM | POA: Diagnosis not present

## 2021-09-22 DIAGNOSIS — H9121 Sudden idiopathic hearing loss, right ear: Secondary | ICD-10-CM

## 2021-09-22 MED ORDER — GADOBENATE DIMEGLUMINE 529 MG/ML IV SOLN
16.0000 mL | Freq: Once | INTRAVENOUS | Status: AC | PRN
  Administered 2021-09-22: 16 mL via INTRAVENOUS

## 2021-09-29 ENCOUNTER — Other Ambulatory Visit: Payer: Self-pay | Admitting: Radiation Therapy

## 2021-09-29 DIAGNOSIS — D333 Benign neoplasm of cranial nerves: Secondary | ICD-10-CM

## 2021-10-05 ENCOUNTER — Other Ambulatory Visit: Payer: Self-pay

## 2021-10-05 ENCOUNTER — Ambulatory Visit
Admission: RE | Admit: 2021-10-05 | Discharge: 2021-10-05 | Disposition: A | Discharge status: Home / Self Care | Payer: BC Managed Care – PPO | Source: Ambulatory Visit | Attending: Radiation Oncology | Admitting: Radiation Oncology

## 2021-10-05 ENCOUNTER — Encounter: Payer: Self-pay | Admitting: Radiation Oncology

## 2021-10-05 VITALS — BP 113/83 | HR 58 | Temp 97.2°F | Resp 20 | Wt 181.8 lb

## 2021-10-05 DIAGNOSIS — D333 Benign neoplasm of cranial nerves: Secondary | ICD-10-CM | POA: Diagnosis not present

## 2021-10-05 DIAGNOSIS — I10 Essential (primary) hypertension: Secondary | ICD-10-CM | POA: Diagnosis not present

## 2021-10-05 DIAGNOSIS — E785 Hyperlipidemia, unspecified: Secondary | ICD-10-CM | POA: Diagnosis not present

## 2021-10-05 DIAGNOSIS — Z86718 Personal history of other venous thrombosis and embolism: Secondary | ICD-10-CM | POA: Insufficient documentation

## 2021-10-05 DIAGNOSIS — Z7901 Long term (current) use of anticoagulants: Secondary | ICD-10-CM | POA: Insufficient documentation

## 2021-10-05 DIAGNOSIS — Z87442 Personal history of urinary calculi: Secondary | ICD-10-CM | POA: Insufficient documentation

## 2021-10-05 DIAGNOSIS — Z8673 Personal history of transient ischemic attack (TIA), and cerebral infarction without residual deficits: Secondary | ICD-10-CM | POA: Insufficient documentation

## 2021-10-05 NOTE — Progress Notes (Signed)
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    ICD-10-CM   1. Right acoustic neuroma ([place])  D33.3     2. Right-sided acoustic neuroma [facility])  D33.3 Ambulatory referral to Neurosurgery      HISTORY OF PRESENT ILLNESS: Daniel Foster is a 60 y.o. male seen at the request of Dr. Lucia Gaskins. He initially presented to his PCP with sudden onset hearing loss in his right ear back in 09/2020. He was referred to Dr. 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 He recommended brain MRI to rule out acoustic neuroma but the patient elected to postpone the MRI for 6 months. The hearing loss and pressure in the right ear has persisted and gradually progressed. Therefore, a brain MRI was performed on 09/22/21 showing a large, approximately 2 cm cisternal dimension, right cerebellopontine angle mass with deep extension into the right IAC, compatible with vestibular schwannoma, with mass effect on the pons without brain edema. Additionally, there was mild chronic microvascular disease and remote right cerebellar lacunar infarct. Fortunately, he has not experienced any neurologic deficits such as facial numbness, pain or imbalance.   His imaging and case were discussed at the recent multidisciplinary brain tumor board and he has been kindly referred today to discuss the potential radiation options for management of the acoustic neuroma. Consensus recommendation was for consideration of microsurgery versus stereotactic radiosurgery.  PREVIOUS RADIATION THERAPY: No  PAST MEDICAL HISTORY:  Past Medical History:  Diagnosis Date    Chest pain    DVT (deep venous thrombosis) (HCC)    Headache    Hyperlipidemia    Hypertension    Kidney stone    Passed X2 (last one 1981)      PAST SURGICAL HISTORY: Past Surgical History:  Procedure Laterality Date   COLONOSCOPY  2010   per Dr. Paulita Fujita, clear, repeat in 56 yrs    [place]  2004   Arthroscopic surgery Left knee for torn Meniscus   TONSILLECTOMY  1966   WRIST FRACTURE SURGERY  April 2010    FAMILY HISTORY:  Family History  Problem Relation Age of Onset   Heart attack Father     SOCIAL HISTORY:  Social History   Socioeconomic History   Marital status: Married    Spouse name: Not on file   Number of children: Not on file   Years of education: Not on file   Highest education level: Not on file  Occupational History   Not on file  Tobacco Use   Smoking status: Never   Smokeless tobacco: Never  Substance and Sexual Activity   Alcohol use: Not on file   Drug use: Not on file   Sexual activity: Not on file  Other Topics Concern   Not on file  Social History Narrative   Not on file   Social Determinants of Health   Financial Resource Strain: Not on file  Food Insecurity: Not on file  Transportation Needs: Not on file  Physical Activity: Not on file  Stress: Not on file  Social Connections: Not on file  Intimate Partner Violence: Not  on file    ALLERGIES: Patient has no known allergies.  MEDICATIONS:  Current Outpatient Medications  Medication Sig Dispense Refill   atenolol (TENORMIN) 50 MG tablet Take 1 tablet (50 mg total) by mouth daily. 365 tablet 0   Multiple Vitamins-Minerals (MULTIVITAMIN MEN 50+ PO) Take by mouth.     rivaroxaban (XARELTO) 20 MG TABS tablet TAKE 1 TABLET BY MOUTH DAILY WITH SUPPER. (Patient taking differently: Take 20 mg by mouth as needed (With traveling). TAKE 1 TABLET BY MOUTH DAILY WITH SUPPER.) 90 tablet 1   traMADol (ULTRAM) 50 MG tablet Take 1-2 tablet by mouth every 6 hours as  needed (Patient not taking: Reported on 10/05/2021) 30 tablet 0   No current facility-administered medications for this encounter.    REVIEW OF SYSTEMS:  On review of systems, the patient reports that he is doing well overall. He has had some persistent, progressive hearing loss on the right along with increased pressure in the right ear but denies imbalance, facial pain or paraesthesias. He denies any chest pain, shortness of breath, cough, fevers, chills, night sweats, unintended weight changes. He denies any bowel or bladder disturbances, and denies abdominal pain, nausea or vomiting. He denies any new musculoskeletal or joint aches or pains. A complete review of systems is obtained and is otherwise negative.    PHYSICAL EXAM:  Wt Readings from Last 3 Encounters:  10/05/21 181 lb 12.8 oz (82.5 kg)  06/14/21 179 lb 4 oz (81.3 kg)  09/09/20 185 lb 9.6 oz (84.2 kg)   Temp Readings from Last 3 Encounters:  10/05/21 (!) 97.2 F (36.2 C)  06/14/21 97.7 F (36.5 C) (Temporal)  03/10/21 (!) 96.8 F (36 C) (Temporal)   BP Readings from Last 3 Encounters:  10/05/21 113/83  06/14/21 110/78  09/09/20 110/70   Pulse Readings from Last 3 Encounters:  10/05/21 (!) 58  06/14/21 (!) 53  09/09/20 (!) 55   Pain Assessment Pain Score: 0-No pain/10  In general this is a well appearing Caucasian male in no acute distress. He's alert and oriented x4 and appropriate throughout the examination. Cardiopulmonary assessment is negative for acute distress and he exhibits normal effort.     KPS = 100  100 - Normal; no complaints; no evidence of disease. 90   - Able to carry on normal activity; minor signs or symptoms of disease. 80   - Normal activity with effort; some signs or symptoms of disease. 37   - Cares for self; unable to carry on normal activity or to do active work. 60   - Requires occasional assistance, but is able to care for most of his personal needs. 50   - Requires considerable  assistance and frequent medical care. 42   - Disabled; requires special care and assistance. 40   - Severely disabled; hospital admission is indicated although death not imminent. 12   - Very sick; hospital admission necessary; active supportive treatment necessary. 10   - Moribund; fatal processes progressing rapidly. 0     - Dead  Karnofsky DA, Abelmann [place], Craver LS and Burchenal [facility] [phone]) The use of the nitrogen mustards in the palliative treatment of carcinoma: with particular reference to bronchogenic carcinoma Cancer 1 634-56  LABORATORY DATA:  Lab Results  Component Value Date   WBC 4.2 05/27/2020   HGB 14.4 05/27/2020   HCT 41.5 05/27/2020   MCV 87.6 05/27/2020   PLT 192.0 05/27/2020   Lab Results  Component Value Date   NA 140  05/27/2020   K 4.1 05/27/2020   CL 106 05/27/2020   CO2 29 05/27/2020   Lab Results  Component Value Date   ALT 16 05/27/2020   AST 19 05/27/2020   ALKPHOS 77 05/27/2020   BILITOT 1.1 05/27/2020     RADIOGRAPHY: MR Brain W Wo Contrast  Result Date: 09/23/2021 CLINICAL DATA:  Hearing loss, sudden onset, no focal neuro deficit EXAM: MRI HEAD WITHOUT AND WITH CONTRAST TECHNIQUE: Multiplanar, multiecho pulse sequences of the brain and surrounding structures were obtained without and with intravenous contrast. CONTRAST:  74mL MULTIHANCE GADOBENATE DIMEGLUMINE 529 MG/ML IV SOLN COMPARISON:  None. FINDINGS: Brain: No acute infarction, hemorrhage, hydrocephalus, or extra-axial fluid collection. Mild scattered T2 hyperintensities in the supra tentorial and pontine white matter, nonspecific but compatible with chronic microvascular ischemic disease. Remote lacunar infarct in the right cerebellum. Besides the mass described below, no abnormal enhancement. Dedicated IAC protocol was performed. Large heterogeneously T2 hyperintense enhancing mass at the right cerebellopontine angle with the extension into the right internal auditory canal to the fundus. The  mass measures up to 2.0 x 2.8 x 1.6 cm with mass effect on the adjacent pons and middle cerebellar peduncle. No appreciable brain edema. The visualized left seventh and eighth cranial nerves are unremarkable. Normal CSF signal within bilateral labyrinth. No mastoid effusions. Vascular: Major arterial flow voids are maintained at the skull base. Skull and upper cervical spine: Normal marrow signal. Sinuses/Orbits: Mild paranasal sinus mucosal thickening. Small retention cysts versus polyps within the maxillary sinuses bilaterally. Unremarkable orbits. IMPRESSION: 1. Large right cerebellopontine angle mass with deep extension into the right IAC, described above and compatible with a vestibular schwannoma. Mass effect on the pons without brain edema. 2. Mild chronic microvascular disease and remote right cerebellar lacunar infarct. Electronically Signed   By: Margaretha Sheffield M.D.   On: 09/23/2021 11:14      IMPRESSION/PLAN: 74. 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  The cisternal component measures approximately 2 cm with some pontine compression without edema.  He has serviceable hearing on the right and no other cranial nerve symptoms.  He would be a good candidate for microsurgery, single fraction SRS, hypofractionated stereotactic radiotherapy (HSRT) (up to 3-5 treatments) or conventionally fractionated stereotactic radiotherapy (up to 28 treatments)  Today, we talked to the patient and family about the findings and workup thus far. We discussed the natural history of acoustic neuromas and general treatment, highlighting the role of radiotherapy in the management. We discussed the available radiation techniques, comparing conventional fractionated radiotherapy against stereotactic radiosurgery and the pros and cons of each and also compared the radiation options with surgery. We focused on the details and logistics of delivery of each option. Given the size and location of this  lesion, the recommendation is for a 5 fraction course of hypopfractionated stereotactic radiotherapy (HSRT) in an effort to preserve his serviceable hearing and minimize side effects. We reviewed the anticipated acute and late sequelae associated with radiation in this setting. The patient was encouraged to ask questions that were answered to his stated satisfaction.  At the end of our conversation, the patient is interested in learning more about the surgical options prior to committing to a treatment. He is currently scheduled to meet with a surgeon at [facility] and an otologist in [place] but will not see them until the end of the month. He is quite anxious to move forward with treatment so we discussed making a referral to Dr. Zada Finders,  here in [place] who is a neurosurgeon who specializes in base of skull surgeries. He was very interested in meeting with Dr. Zada Finders so we will try our best to coordinate and expedite the consult. Dr. Zada Finders is aware of his case and was present to review the imaging at our recent tumor board discussion. The patient has our contact information and will let us know if he ultimately decides to have fractionated SRS here locally. We look forward to following his progress and are more than happy to continue to participate in his care.  We personally spent 70 minutes in this encounter including chart review, reviewing radiological studies, meeting face-to-face with the patient, entering orders and completing documentation.    Nicholos Johns, PA-C    Tyler Pita, MD  [facility] Direct Dial: [phone]  Fax: [phone] [email or web address]  Skype  [email or web address]   This document serves as a record of services personally performed by Tyler Pita, MD and Freeman Caldron, PA-C. It was created on their behalf by Wilburn Mylar, a trained medical scribe. The creation of this record is based on the scribe's personal observations and the  provider's statements to them. This document has been checked and approved by the attending provider.

## 2021-10-05 NOTE — Progress Notes (Signed)
Acoustic Neuroma  Patient reports hearing loss in right ear, ringing that first started in left ear and then moved to the right ear.  Denies loss of balance, facial numbness, vertigo/dizziness, facial paralysis, tingling in toes /feet, swelling in toes/feet, burning in toes/feet, no nausea or vomiting.  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  Hearing sudden onset getting worse in right ear states has about 25% hearing.  "It feels like water in the ear, cause sound to be muted".  "I will stick my finger in ear and it clears up briefly for about 2 hours.  No prior radiation treatment.  No swallowing or appetite loss.

## 2021-10-06 ENCOUNTER — Other Ambulatory Visit: Payer: Self-pay | Admitting: Urology

## 2021-10-06 DIAGNOSIS — D333 Benign neoplasm of cranial nerves: Secondary | ICD-10-CM

## 2021-10-07 ENCOUNTER — Telehealth: Payer: Self-pay | Admitting: *Deleted

## 2021-10-07 NOTE — Telephone Encounter (Signed)
CALLED PATIENT TO INFORM OF [place] VISIT WITH DR. CHAN ON 10-11-21- ARRIVAL TIME- 1:15 PM , LVM FOR  A RETURN CALL

## 2021-10-14 DIAGNOSIS — D333 Benign neoplasm of cranial nerves: Secondary | ICD-10-CM | POA: Diagnosis not present

## 2021-11-04 DIAGNOSIS — D333 Benign neoplasm of cranial nerves: Secondary | ICD-10-CM | POA: Diagnosis not present

## 2021-12-16 DIAGNOSIS — Z510 Encounter for antineoplastic radiation therapy: Secondary | ICD-10-CM | POA: Diagnosis not present

## 2021-12-16 DIAGNOSIS — D333 Benign neoplasm of cranial nerves: Secondary | ICD-10-CM | POA: Diagnosis not present

## 2022-01-10 ENCOUNTER — Other Ambulatory Visit (HOSPITAL_COMMUNITY): Payer: Self-pay

## 2022-01-10 MED ORDER — PEG 3350-KCL-NA BICARB-NACL 420 G PO SOLR
ORAL | 0 refills | Status: DC
  Filled 2022-01-10: qty 4000, 1d supply, fill #0

## 2022-01-13 DIAGNOSIS — D124 Benign neoplasm of descending colon: Secondary | ICD-10-CM | POA: Diagnosis not present

## 2022-01-13 DIAGNOSIS — Z1211 Encounter for screening for malignant neoplasm of colon: Secondary | ICD-10-CM | POA: Diagnosis not present

## 2022-01-13 DIAGNOSIS — D123 Benign neoplasm of transverse colon: Secondary | ICD-10-CM | POA: Diagnosis not present

## 2022-01-13 DIAGNOSIS — K5730 Diverticulosis of large intestine without perforation or abscess without bleeding: Secondary | ICD-10-CM | POA: Diagnosis not present

## 2022-02-16 ENCOUNTER — Encounter: Payer: Self-pay | Admitting: Family Medicine

## 2022-02-16 ENCOUNTER — Other Ambulatory Visit (HOSPITAL_COMMUNITY): Payer: Self-pay

## 2022-02-16 ENCOUNTER — Ambulatory Visit (INDEPENDENT_AMBULATORY_CARE_PROVIDER_SITE_OTHER): Payer: BC Managed Care – PPO | Admitting: Family Medicine

## 2022-02-16 VITALS — BP 122/78 | HR 56 | Temp 99.1°F | Ht 70.0 in | Wt 183.0 lb

## 2022-02-16 DIAGNOSIS — I10 Essential (primary) hypertension: Secondary | ICD-10-CM | POA: Diagnosis not present

## 2022-02-16 DIAGNOSIS — Z0000 Encounter for general adult medical examination without abnormal findings: Secondary | ICD-10-CM

## 2022-02-16 DIAGNOSIS — N289 Disorder of kidney and ureter, unspecified: Secondary | ICD-10-CM

## 2022-02-16 DIAGNOSIS — G4730 Sleep apnea, unspecified: Secondary | ICD-10-CM

## 2022-02-16 DIAGNOSIS — R944 Abnormal results of kidney function studies: Secondary | ICD-10-CM

## 2022-02-16 DIAGNOSIS — Z125 Encounter for screening for malignant neoplasm of prostate: Secondary | ICD-10-CM | POA: Diagnosis not present

## 2022-02-16 LAB — BASIC METABOLIC PANEL
BUN: 16 mg/dL (ref 6–23)
CO2: 29 mEq/L (ref 19–32)
Calcium: 9.2 mg/dL (ref 8.4–10.5)
Chloride: 104 mEq/L (ref 96–112)
Creatinine, Ser: 1.89 mg/dL — ABNORMAL HIGH (ref 0.40–1.50)
GFR: 37.55 mL/min — ABNORMAL LOW (ref >60.00)
Glucose, Bld: 77 mg/dL (ref 70–99)
Potassium: 4.1 mEq/L (ref 3.5–5.1)
Sodium: 141 mEq/L (ref 135–145)

## 2022-02-16 LAB — CBC WITH DIFFERENTIAL/PLATELET
Basophils Absolute: 0 10*3/uL (ref 0.0–0.1)
Basophils Relative: 0.3 % (ref 0.0–3.0)
Eosinophils Absolute: 0.1 10*3/uL (ref 0.0–0.7)
Eosinophils Relative: 0.6 % (ref 0.0–5.0)
HCT: 43.1 % (ref 39.0–52.0)
Hemoglobin: 14.7 g/dL (ref 13.0–17.0)
Lymphocytes Relative: 9.5 % — ABNORMAL LOW (ref 12.0–46.0)
Lymphs Abs: 0.9 10*3/uL (ref 0.7–4.0)
MCHC: 34.2 g/dL (ref 30.0–36.0)
MCV: 88.5 fl (ref 78.0–100.0)
Monocytes Absolute: 1 10*3/uL (ref 0.1–1.0)
Monocytes Relative: 10.6 % (ref 3.0–12.0)
Neutro Abs: 7.6 10*3/uL (ref 1.4–7.7)
Neutrophils Relative %: 79 % — ABNORMAL HIGH (ref 43.0–77.0)
Platelets: 170 10*3/uL (ref 150.0–400.0)
RBC: 4.87 Mil/uL (ref 4.22–5.81)
RDW: 13.1 % (ref 11.5–15.5)
WBC: 9.6 10*3/uL (ref 4.0–10.5)

## 2022-02-16 LAB — HEMOGLOBIN A1C: Hgb A1c MFr Bld: 5.3 % (ref 4.6–6.5)

## 2022-02-16 LAB — LIPID PANEL
Cholesterol: 201 mg/dL — ABNORMAL HIGH (ref 0–200)
HDL: 53.2 mg/dL (ref >39.00)
LDL Cholesterol: 130 mg/dL — ABNORMAL HIGH (ref 0–99)
NonHDL: 147.75
Total CHOL/HDL Ratio: 4
Triglycerides: 89 mg/dL (ref 0.0–149.0)
VLDL: 17.8 mg/dL (ref 0.0–40.0)

## 2022-02-16 LAB — HEPATIC FUNCTION PANEL
ALT: 30 U/L (ref 0–53)
AST: 44 U/L — ABNORMAL HIGH (ref 0–37)
Albumin: 4.3 g/dL (ref 3.5–5.2)
Alkaline Phosphatase: 73 U/L (ref 39–117)
Bilirubin, Direct: 0.2 mg/dL (ref 0.0–0.3)
Total Bilirubin: 1 mg/dL (ref 0.2–1.2)
Total Protein: 6.3 g/dL (ref 6.0–8.3)

## 2022-02-16 LAB — PSA: PSA: 1.25 ng/mL (ref 0.10–4.00)

## 2022-02-16 LAB — TSH: TSH: 2.27 u[IU]/mL (ref 0.35–5.50)

## 2022-02-16 MED ORDER — RIVAROXABAN 20 MG PO TABS
ORAL_TABLET | ORAL | 1 refills | Status: DC
  Filled 2022-02-16: qty 90, 90d supply, fill #0
  Filled 2022-07-01: qty 90, 90d supply, fill #1

## 2022-02-16 MED ORDER — ATENOLOL 50 MG PO TABS
50.0000 mg | ORAL_TABLET | Freq: Every day | ORAL | 0 refills | Status: DC
  Filled 2022-02-16: qty 365, 365d supply, fill #0

## 2022-02-16 NOTE — Progress Notes (Signed)
? ?Subjective:  ? ? Patient ID: Daniel Foster, male    DOB: 1961-08-10, 61 y.o.   MRN: [id number] ? ?HPI ?Here for a well exam. He is doing well except he seems to be trying to pass a kidney stone. The last time he had a CT scan at [facility] he had one small stone in the left kidney.  Now for the past 3 days he has had intermittent sharp pains in the left flank, and he is certain it is from the stone. No abdominal pain or bloating. He is urinating freely, no blood in the urine. He had more severe pains at first but these are getting better. He had some nausea at first but not now. He does not want any pain medication right now. He had a Gamma knife procedure in January for a right sided acoustic neuroma (vestibular Schwannoma) per [facility]. He has had greatly reduced hearing on the right side, and this has slightly improved. No headaches or dizziness. Finally he asks to have a sleep study. He says he sleeps well and he wakes up feeling refreshed. However his wife says he is very restless in his sleep and he sometimes stops breathing briefly. No snoring.  ? ? ?Review of Systems  ?Constitutional: Negative.   ?HENT:  Positive for hearing loss.   ?Eyes: Negative.   ?Respiratory: Negative.    ?Cardiovascular: Negative.   ?Gastrointestinal: Negative.   ?Genitourinary:  Positive for flank pain.  ?Skin: Negative.   ?Neurological: Negative.   ?Psychiatric/Behavioral: Negative.    ? ?   ?Objective:  ? Physical Exam ?Constitutional:   ?   General: He is not in acute distress. ?   Appearance: Normal appearance. He is well-developed. He is not diaphoretic.  ?HENT:  ?   Head: Normocephalic and atraumatic.  ?   Right Ear: External ear normal.  ?   Left Ear: External ear normal.  ?   Nose: Nose normal.  ?   Mouth/Throat:  ?   Pharynx: No oropharyngeal exudate.  ?Eyes:  ?   General: No scleral icterus.    ?   Right eye: No discharge.     ?   Left eye: No discharge.  ?   Conjunctiva/sclera: Conjunctivae  normal.  ?   Pupils: Pupils are equal, round, and reactive to light.  ?Neck:  ?   Thyroid: No thyromegaly.  ?   Vascular: No JVD.  ?   Trachea: No tracheal deviation.  ?Cardiovascular:  ?   Rate and Rhythm: Normal rate and regular rhythm.  ?   Heart sounds: Normal heart sounds. No murmur heard. ?  No friction rub. No gallop.  ?Pulmonary:  ?   Effort: Pulmonary effort is normal. No respiratory distress.  ?   Breath sounds: Normal breath sounds. No wheezing or rales.  ?Chest:  ?   Chest wall: No tenderness.  ?Abdominal:  ?   General: Bowel sounds are normal. There is no distension.  ?   Palpations: Abdomen is soft. There is no mass.  ?   Tenderness: There is no abdominal tenderness. There is no right CVA tenderness, left CVA tenderness, guarding or rebound.  ?   Hernia: No hernia is present.  ?Genitourinary: ?   Penis: Normal. No tenderness.   ?   Testes: Normal.  ?   Prostate: Normal.  ?   Rectum: Normal. Guaiac result negative.  ?Musculoskeletal:     ?   General: No tenderness. Normal range of motion.  ?  Cervical back: Neck supple.  ?Lymphadenopathy:  ?   Cervical: No cervical adenopathy.  ?Skin: ?   General: Skin is warm and dry.  ?   Coloration: Skin is not pale.  ?   Findings: No erythema or rash.  ?Neurological:  ?   Mental Status: He is alert and oriented to person, place, and time.  ?   Cranial Nerves: No cranial nerve deficit.  ?   Motor: No abnormal muscle tone.  ?   Coordination: Coordination normal.  ?   Deep Tendon Reflexes: Reflexes are normal and symmetric. Reflexes normal.  ?Psychiatric:     ?   Behavior: Behavior normal.     ?   Thought Content: Thought content normal.     ?   Judgment: Judgment normal.  ? ? ? ? ? ?   ?Assessment & Plan:  ?Well exam. We discussed diet and exercise. Get fasting labs today. He will follow up with ENT about the neuroma. He is likely dealing with a left sided kidney stone. He will try to pass this on his own, but he knows to go to the ED if needed. We will refer him to  Pulmonology for a sleep evaluation.  ?Alysia Penna, MD ? ?

## 2022-02-17 NOTE — Addendum Note (Signed)
Addended by: Alysia Penna A on: 02/17/2022 07:46 AM ? ? Modules accepted: Orders ? ?

## 2022-02-18 ENCOUNTER — Other Ambulatory Visit (HOSPITAL_COMMUNITY): Payer: Self-pay

## 2022-02-24 NOTE — Addendum Note (Signed)
Addended by: Agnes Lawrence on: 02/24/2022 11:21 AM ? ? Modules accepted: Orders ? ?

## 2022-03-12 DIAGNOSIS — Z20822 Contact with and (suspected) exposure to covid-19: Secondary | ICD-10-CM | POA: Diagnosis not present

## 2022-03-24 ENCOUNTER — Other Ambulatory Visit: Payer: BC Managed Care – PPO

## 2022-04-21 ENCOUNTER — Other Ambulatory Visit: Payer: BC Managed Care – PPO

## 2022-07-01 ENCOUNTER — Other Ambulatory Visit (HOSPITAL_COMMUNITY): Payer: Self-pay

## 2022-07-07 DIAGNOSIS — D333 Benign neoplasm of cranial nerves: Secondary | ICD-10-CM | POA: Diagnosis not present

## 2022-07-07 DIAGNOSIS — Z923 Personal history of irradiation: Secondary | ICD-10-CM | POA: Diagnosis not present

## 2022-07-07 DIAGNOSIS — Z9889 Other specified postprocedural states: Secondary | ICD-10-CM | POA: Diagnosis not present

## 2022-08-18 ENCOUNTER — Telehealth: Payer: Self-pay | Admitting: Family Medicine

## 2022-08-18 NOTE — Telephone Encounter (Signed)
Patient dropped off paperwork that needed to be completed. Paperwork placed in folder for completion.    Please advise

## 2022-08-19 NOTE — Telephone Encounter (Signed)
Pt paperwork was received and placed on Dr Sarajane Jews red folder for completing, pt will be notified when ready

## 2022-08-26 NOTE — Telephone Encounter (Signed)
Pt picked up paperwork from the office yesterday from the office

## 2022-09-05 ENCOUNTER — Other Ambulatory Visit (HOSPITAL_COMMUNITY): Payer: Self-pay

## 2022-09-05 MED ORDER — METHYLPREDNISOLONE 4 MG PO TBPK
ORAL_TABLET | ORAL | 0 refills | Status: DC
  Filled 2022-09-05: qty 21, 6d supply, fill #0

## 2022-09-19 ENCOUNTER — Other Ambulatory Visit (HOSPITAL_COMMUNITY): Payer: Self-pay

## 2022-09-19 MED ORDER — METHYLPREDNISOLONE 4 MG PO TBPK
ORAL_TABLET | ORAL | 0 refills | Status: DC
  Filled 2022-09-19: qty 21, 6d supply, fill #0

## 2022-09-20 ENCOUNTER — Other Ambulatory Visit (HOSPITAL_COMMUNITY): Payer: Self-pay

## 2023-04-20 IMAGING — MR MR HEAD WO/W CM
11 of 12 series · 39 of 48 positions shown · IV contrast (16 ml multihance)
Comparison: None.

CLINICAL DATA: Hearing loss, sudden onset, no focal neuro deficit

EXAM:
MRI HEAD WITHOUT AND WITH CONTRAST
TECHNIQUE: Multiplanar, multiecho pulse sequences of the brain and surrounding
structures were obtained without and with intravenous contrast.
CONTRAST:  16mL MULTIHANCE GADOBENATE DIMEGLUMINE 529 MG/ML IV SOLN

[Series 2: T1 · sagittal · 5.0mm · 0.45mm/px · 3 of 23 slices shown (1 of 3)]
[im 1/23]
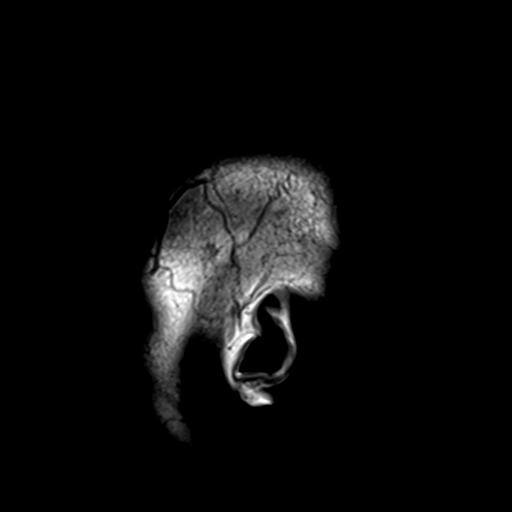
[im 12/23]
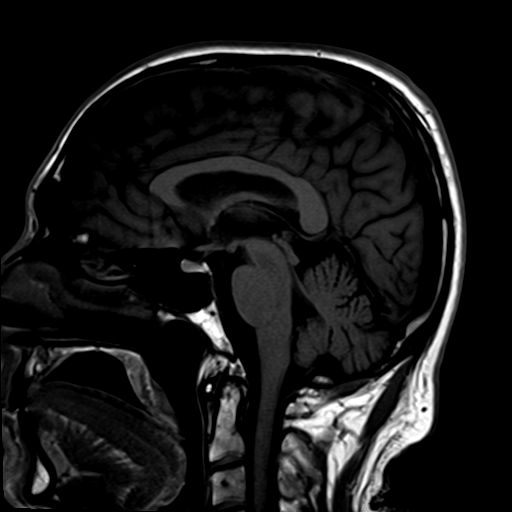
[im 23/23]
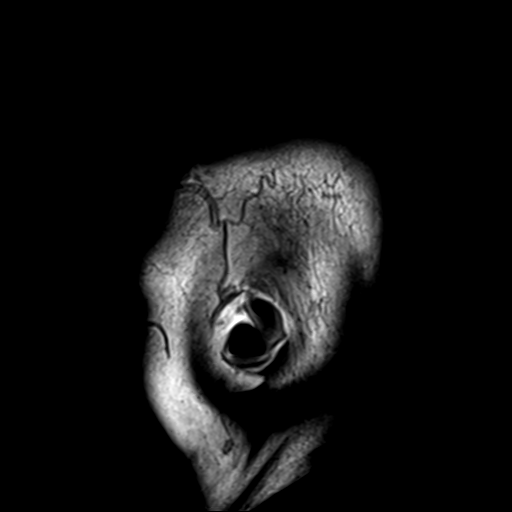

[Series 3: DWI · axial · 3.0mm · 1.80mm/px · z∈[-43,+100]mm · 11 of 100 slices shown (1 of 2)]
[im 1/100]
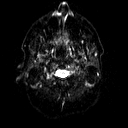
[im 10/100]
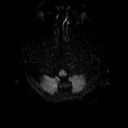
[im 20/100]
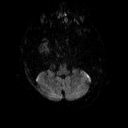
[im 30/100]
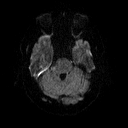
[im 40/100]
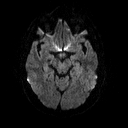
[im 50/100]
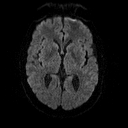
[im 60/100]
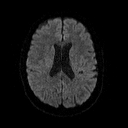
[im 70/100]
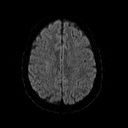
[im 80/100]
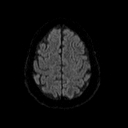
[im 90/100]
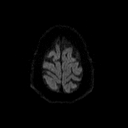
[im 100/100]
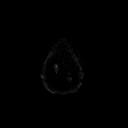

[Series 4: DWI · axial · 3.0mm · 1.80mm/px · z∈[-43,+100]mm · 6 of 50 slices shown (2 of 2)]
[im 1/50]
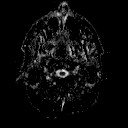
[im 10/50]
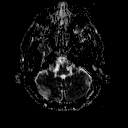
[im 20/50]
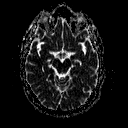
[im 30/50]
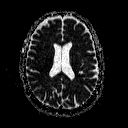
[im 40/50]
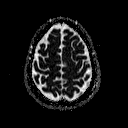
[im 50/50]
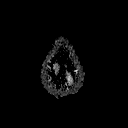

[Series 5: T2 · axial · 5.0mm · 0.45mm/px · z∈[-49,+109]mm · 3 of 26 slices shown]
[im 1/26]
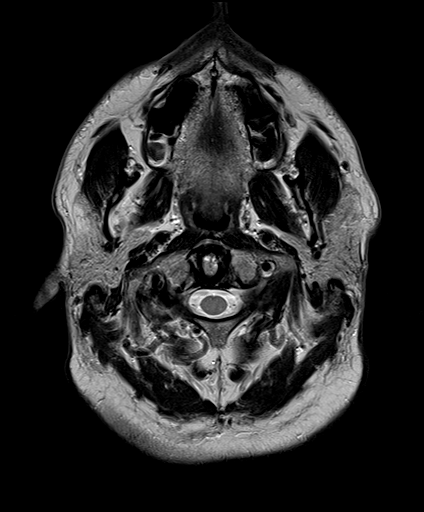
[im 13/26]
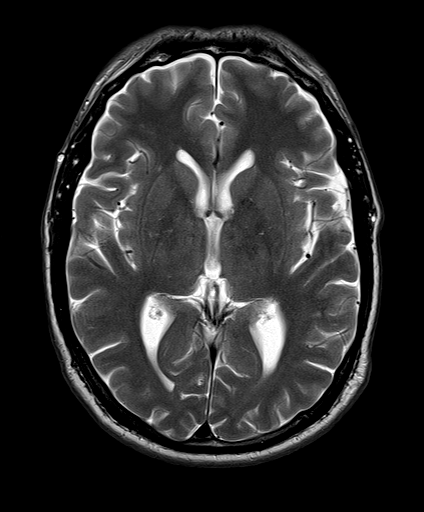
[im 26/26]
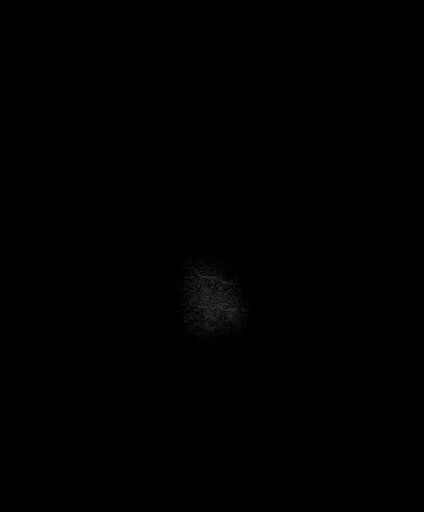

[Series 6: FLAIR · axial · 3.0mm · 0.45mm/px · z∈[-46,+106]mm · 3 of 27 slices shown]
[im 1/27]
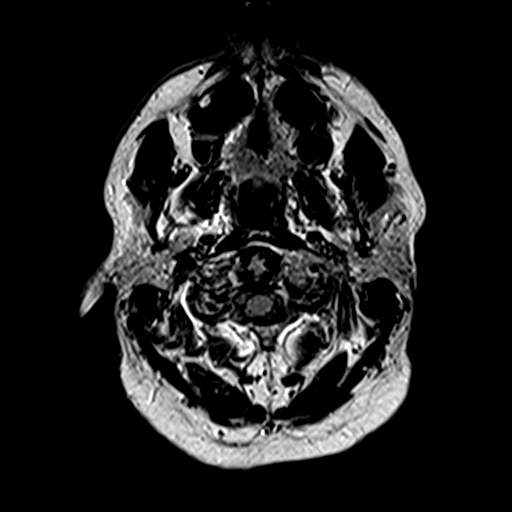
[im 14/27]
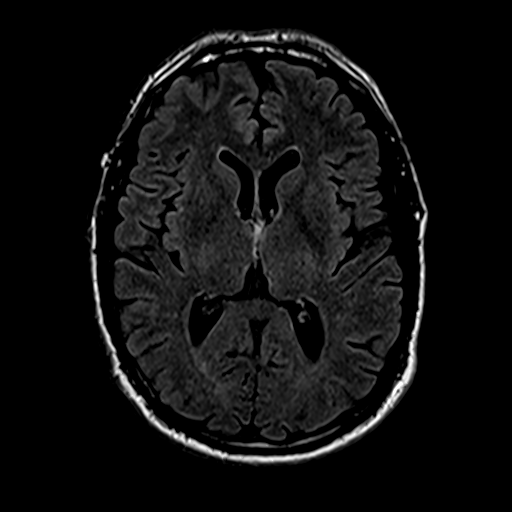
[im 27/27]
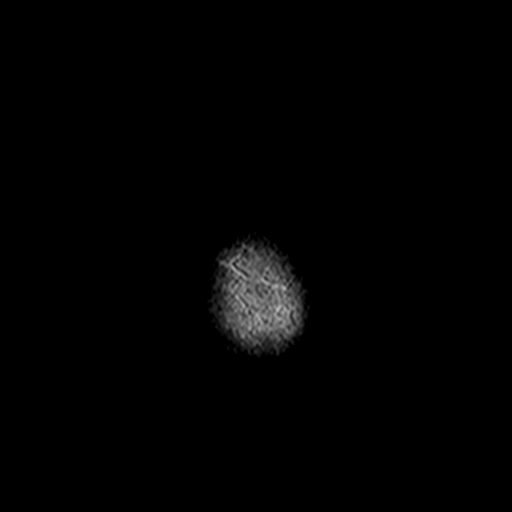

[Series 8: swi_images · axial · 3.0mm · 0.90mm/px · z∈[-45,+105]mm · 6 of 52 slices shown]
[im 1/52]
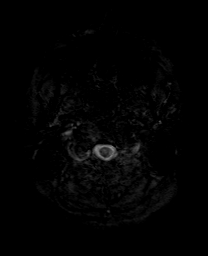
[im 11/52]
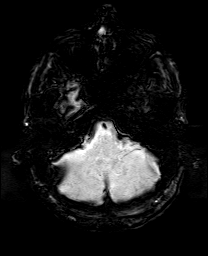
[im 21/52]
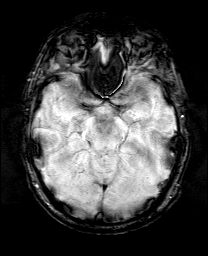
[im 31/52]
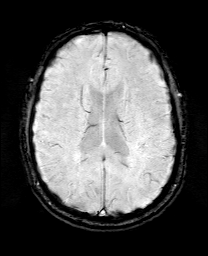
[im 41/52]
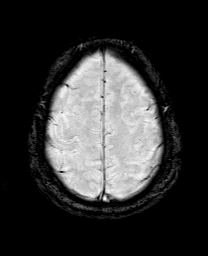
[im 52/52]
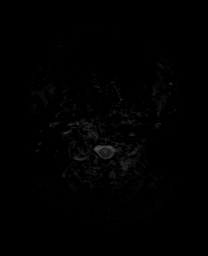

[Series 9: T1 · coronal · 3.0mm · 0.35mm/px · 1 of 11 slices shown (2 of 3)]
[im 1/11]
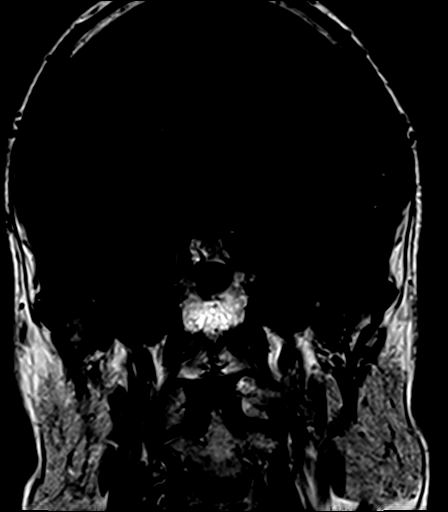

[Series 10: bSSFP · axial · 1.0mm · 0.28mm/px · z∈[-54,-32]mm · 3 of 36 slices shown]
[im 1/36]
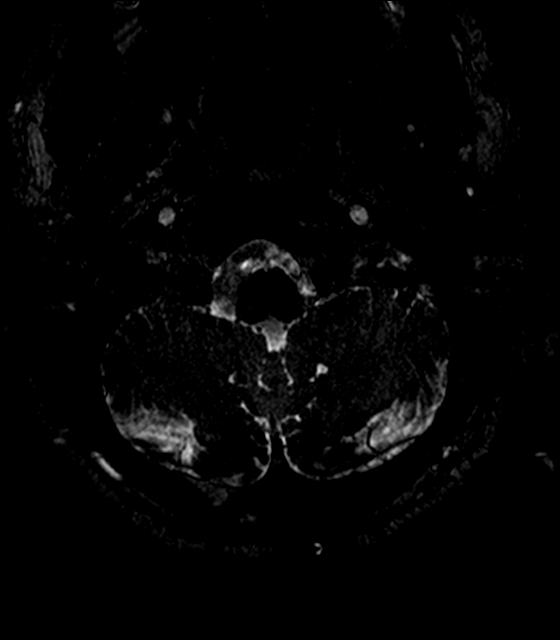
[im 12/36]
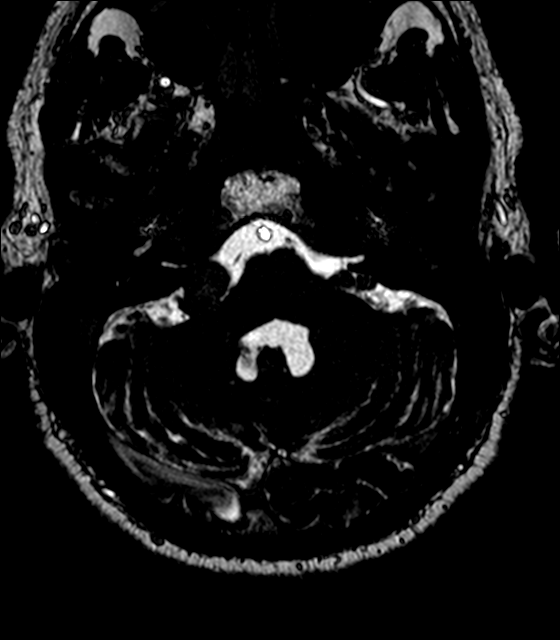
[im 24/36]
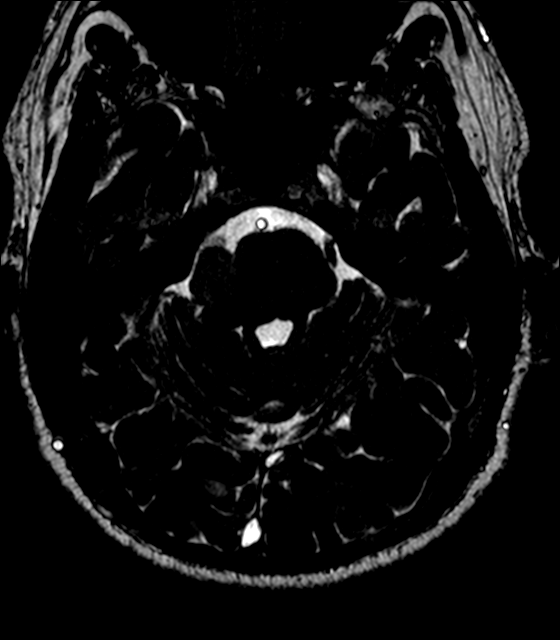

[Series 11: T1 · axial · 3.0mm · 0.35mm/px · 1 of 11 slices shown (3 of 3)]
[im 1/11]
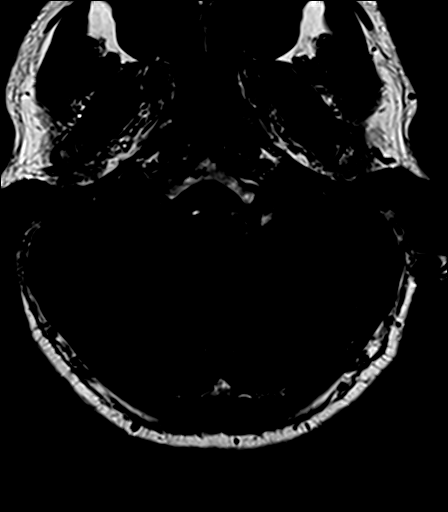

[Series 12: T1 post-contrast · coronal · 3.0mm · 0.35mm/px · 1 of 11 slices shown (1 of 2)]
[im 1/11]
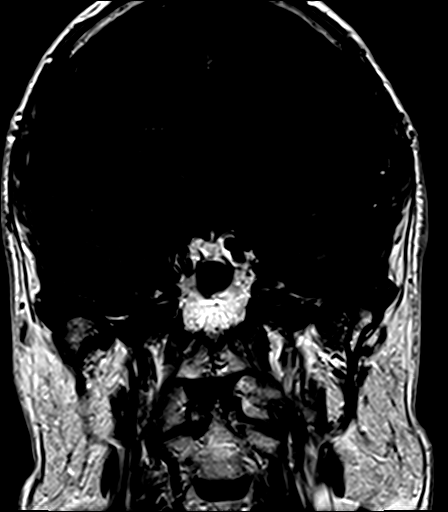

[Series 13: T1 post-contrast · axial · 3.0mm · 0.35mm/px · 1 of 11 slices shown (2 of 2)]
[im 1/11]
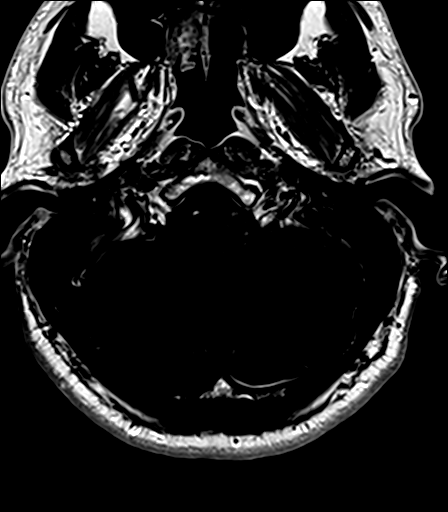

[39 of 48 positions shown; findings below may reference images not displayed]

FINDINGS: Brain: No acute infarction, hemorrhage, hydrocephalus, or
extra-axial fluid collection. Mild scattered T2 hyperintensities in
the supra tentorial and pontine white matter, nonspecific but
compatible with chronic microvascular ischemic disease. Remote
lacunar infarct in the right cerebellum. Besides the mass described
below, no abnormal enhancement.

Dedicated IAC protocol was performed. Large heterogeneously T2
hyperintense enhancing mass at the right cerebellopontine angle with
the extension into the right internal auditory canal to the fundus.
The mass measures up to 2.0 x 2.8 x 1.6 cm with mass effect on the
adjacent pons and middle cerebellar peduncle. No appreciable brain
edema. The visualized left seventh and eighth cranial nerves are
unremarkable. Normal CSF signal within bilateral labyrinth. No
mastoid effusions.

Vascular: Major arterial flow voids are maintained at the skull
base.

Skull and upper cervical spine: Normal marrow signal.

Sinuses/Orbits: Mild paranasal sinus mucosal thickening. Small
retention cysts versus polyps within the maxillary sinuses
bilaterally. Unremarkable orbits.
IMPRESSION: 1. Large right cerebellopontine angle mass with deep extension into
the right IAC, described above and compatible with a vestibular
schwannoma. Mass effect on the pons without brain edema.
2. Mild chronic microvascular disease and remote right cerebellar
lacunar infarct.

## 2023-05-24 DIAGNOSIS — H10413 Chronic giant papillary conjunctivitis, bilateral: Secondary | ICD-10-CM | POA: Diagnosis not present

## 2023-05-24 DIAGNOSIS — H43811 Vitreous degeneration, right eye: Secondary | ICD-10-CM | POA: Diagnosis not present

## 2023-05-24 DIAGNOSIS — H524 Presbyopia: Secondary | ICD-10-CM | POA: Diagnosis not present

## 2023-08-04 ENCOUNTER — Telehealth: Payer: Self-pay | Admitting: Family Medicine

## 2023-08-04 DIAGNOSIS — J3489 Other specified disorders of nose and nasal sinuses: Secondary | ICD-10-CM

## 2023-08-04 NOTE — Telephone Encounter (Signed)
Pt would like a referral to [facility] in [place].  Phone: [phone].  Please give patient a call back.  This is for nasal drainage that he has had at least since 2010, possible CSF leak.

## 2023-08-08 NOTE — Telephone Encounter (Signed)
I did the referral 

## 2023-08-10 NOTE — Telephone Encounter (Signed)
Left a message for pt advised that Dr Clent Ridges has placed referral

## 2023-08-17 ENCOUNTER — Ambulatory Visit (INDEPENDENT_AMBULATORY_CARE_PROVIDER_SITE_OTHER): Payer: BC Managed Care – PPO | Admitting: Family Medicine

## 2023-08-17 ENCOUNTER — Encounter: Payer: Self-pay | Admitting: Family Medicine

## 2023-08-17 ENCOUNTER — Other Ambulatory Visit (HOSPITAL_COMMUNITY): Payer: Self-pay

## 2023-08-17 VITALS — BP 118/80 | HR 52 | Temp 98.1°F | Ht 70.0 in | Wt 178.0 lb

## 2023-08-17 DIAGNOSIS — Z0000 Encounter for general adult medical examination without abnormal findings: Secondary | ICD-10-CM | POA: Diagnosis not present

## 2023-08-17 DIAGNOSIS — Z23 Encounter for immunization: Secondary | ICD-10-CM | POA: Diagnosis not present

## 2023-08-17 DIAGNOSIS — I10 Essential (primary) hypertension: Secondary | ICD-10-CM

## 2023-08-17 LAB — BASIC METABOLIC PANEL
BUN: 13 mg/dL (ref 6–23)
CO2: 28 meq/L (ref 19–32)
Calcium: 9.1 mg/dL (ref 8.4–10.5)
Chloride: 104 meq/L (ref 96–112)
Creatinine, Ser: 1.15 mg/dL (ref 0.40–1.50)
GFR: 67.44 mL/min (ref >60.00)
Glucose, Bld: 81 mg/dL (ref 70–99)
Potassium: 4.1 meq/L (ref 3.5–5.1)
Sodium: 141 meq/L (ref 135–145)

## 2023-08-17 LAB — LIPID PANEL
Cholesterol: 200 mg/dL (ref 0–200)
HDL: 50.3 mg/dL (ref >39.00)
LDL Cholesterol: 126 mg/dL — ABNORMAL HIGH (ref 0–99)
NonHDL: 149.87
Total CHOL/HDL Ratio: 4
Triglycerides: 118 mg/dL (ref 0.0–149.0)
VLDL: 23.6 mg/dL (ref 0.0–40.0)

## 2023-08-17 LAB — PSA: PSA: 4.47 ng/mL — ABNORMAL HIGH (ref 0.10–4.00)

## 2023-08-17 LAB — CBC WITH DIFFERENTIAL/PLATELET
Basophils Absolute: 0 10*3/uL (ref 0.0–0.1)
Basophils Relative: 0.4 % (ref 0.0–3.0)
Eosinophils Absolute: 0.1 10*3/uL (ref 0.0–0.7)
Eosinophils Relative: 1.1 % (ref 0.0–5.0)
HCT: 48.2 % (ref 39.0–52.0)
Hemoglobin: 15.7 g/dL (ref 13.0–17.0)
Lymphocytes Relative: 17.7 % (ref 12.0–46.0)
Lymphs Abs: 1.1 10*3/uL (ref 0.7–4.0)
MCHC: 32.6 g/dL (ref 30.0–36.0)
MCV: 89.1 fl (ref 78.0–100.0)
Monocytes Absolute: 0.7 10*3/uL (ref 0.1–1.0)
Monocytes Relative: 11.9 % (ref 3.0–12.0)
Neutro Abs: 4.1 10*3/uL (ref 1.4–7.7)
Neutrophils Relative %: 68.9 % (ref 43.0–77.0)
Platelets: 197 10*3/uL (ref 150.0–400.0)
RBC: 5.41 Mil/uL (ref 4.22–5.81)
RDW: 13 % (ref 11.5–15.5)
WBC: 6 10*3/uL (ref 4.0–10.5)

## 2023-08-17 LAB — HEPATIC FUNCTION PANEL
ALT: 19 U/L (ref 0–53)
AST: 26 U/L (ref 0–37)
Albumin: 4.2 g/dL (ref 3.5–5.2)
Alkaline Phosphatase: 89 U/L (ref 39–117)
Bilirubin, Direct: 0.2 mg/dL (ref 0.0–0.3)
Total Bilirubin: 1.2 mg/dL (ref 0.2–1.2)
Total Protein: 6.6 g/dL (ref 6.0–8.3)

## 2023-08-17 LAB — TSH: TSH: 1.73 u[IU]/mL (ref 0.35–5.50)

## 2023-08-17 LAB — HEMOGLOBIN A1C: Hgb A1c MFr Bld: 5.5 % (ref 4.6–6.5)

## 2023-08-17 MED ORDER — ATENOLOL 50 MG PO TABS
50.0000 mg | ORAL_TABLET | Freq: Every day | ORAL | 0 refills | Status: AC
  Filled 2023-08-17: qty 90, 90d supply, fill #0
  Filled 2023-09-01: qty 365, 365d supply, fill #0

## 2023-08-17 MED ORDER — RIVAROXABAN 20 MG PO TABS
20.0000 mg | ORAL_TABLET | Freq: Every day | ORAL | 1 refills | Status: DC
  Filled 2023-08-17 – 2023-09-01 (×2): qty 90, 90d supply, fill #0
  Filled 2024-04-22: qty 90, 90d supply, fill #1

## 2023-08-17 NOTE — Progress Notes (Signed)
Subjective:    Patient ID: Daniel Foster, male    DOB: 1961/04/10, 62 y.o.   MRN: [id number]  HPI Here for a well exam. He has no complaints today. He has had a chronic slow nasal drip for months, and he believes this is a CSF leak. He will be seeing Dr. Harriette Ohara (ENT) in [facility] for this. He still takes Xarelto only as needed (for long car rides or plane flights).    Review of Systems  Constitutional: Negative.   HENT:  Positive for rhinorrhea.   Eyes: Negative.   Respiratory: Negative.    Cardiovascular: Negative.   Gastrointestinal: Negative.   Genitourinary: Negative.   Musculoskeletal: Negative.   Skin: Negative.   Neurological: Negative.   Psychiatric/Behavioral: Negative.         Objective:   Physical Exam Constitutional:      General: He is not in acute distress.    Appearance: Normal appearance. He is well-developed. He is not diaphoretic.  HENT:     Head: Normocephalic and atraumatic.     Right Ear: External ear normal.     Left Ear: External ear normal.     Nose: Nose normal.     Mouth/Throat:     Pharynx: No oropharyngeal exudate.  Eyes:     General: No scleral icterus.       Right eye: No discharge.        Left eye: No discharge.     Conjunctiva/sclera: Conjunctivae normal.     Pupils: Pupils are equal, round, and reactive to light.  Neck:     Thyroid: No thyromegaly.     Vascular: No JVD.     Trachea: No tracheal deviation.  Cardiovascular:     Rate and Rhythm: Normal rate and regular rhythm.     Pulses: Normal pulses.     Heart sounds: Normal heart sounds. No murmur heard.    No friction rub. No gallop.  Pulmonary:     Effort: Pulmonary effort is normal. No respiratory distress.     Breath sounds: Normal breath sounds. No wheezing or rales.  Chest:     Chest wall: No tenderness.  Abdominal:     General: Bowel sounds are normal. There is no distension.     Palpations: Abdomen is soft. There is no mass.     Tenderness: There is no  abdominal tenderness. There is no guarding or rebound.  Genitourinary:    Penis: Normal. No tenderness.      Testes: Normal.     Prostate: Normal.     Rectum: Normal. Guaiac result negative.  Musculoskeletal:        General: No tenderness. Normal range of motion.     Cervical back: Neck supple.  Lymphadenopathy:     Cervical: No cervical adenopathy.  Skin:    General: Skin is warm and dry.     Coloration: Skin is not pale.     Findings: No erythema or rash.  Neurological:     General: No focal deficit present.     Mental Status: He is alert and oriented to person, place, and time.     Cranial Nerves: No cranial nerve deficit.     Motor: No abnormal muscle tone.     Coordination: Coordination normal.     Deep Tendon Reflexes: Reflexes are normal and symmetric. Reflexes normal.  Psychiatric:        Mood and Affect: Mood normal.        Behavior: Behavior normal.  Thought Content: Thought content normal.        Judgment: Judgment normal.           Assessment & Plan:  Well exam. We discussed diet and exercise. Get fasting labs. Gershon Crane, MD

## 2023-08-17 NOTE — Addendum Note (Signed)
Addended by: Carola Rhine on: 08/17/2023 11:42 AM   Modules accepted: Orders

## 2023-08-28 ENCOUNTER — Other Ambulatory Visit (HOSPITAL_COMMUNITY): Payer: Self-pay

## 2023-09-01 ENCOUNTER — Other Ambulatory Visit (HOSPITAL_COMMUNITY): Payer: Self-pay

## 2023-10-10 DIAGNOSIS — D333 Benign neoplasm of cranial nerves: Secondary | ICD-10-CM | POA: Diagnosis not present

## 2023-10-12 DIAGNOSIS — G9601 Cranial cerebrospinal fluid leak, spontaneous: Secondary | ICD-10-CM | POA: Diagnosis not present

## 2023-10-12 DIAGNOSIS — D333 Benign neoplasm of cranial nerves: Secondary | ICD-10-CM | POA: Diagnosis not present

## 2023-10-19 ENCOUNTER — Telehealth: Payer: Self-pay | Admitting: Family Medicine

## 2023-10-19 DIAGNOSIS — G4730 Sleep apnea, unspecified: Secondary | ICD-10-CM

## 2023-10-19 NOTE — Telephone Encounter (Signed)
Pt would like a referral for a sleep study. He states it had to be postponed due to his MRI, but now that his MRI is complete he would like referral to be placed.

## 2023-10-20 NOTE — Telephone Encounter (Signed)
I did the referral 

## 2023-10-20 NOTE — Addendum Note (Signed)
Addended by: Gershon Crane A on: 10/20/2023 12:52 PM   Modules accepted: Orders

## 2023-10-20 NOTE — Telephone Encounter (Signed)
Pt.notified

## 2023-11-16 ENCOUNTER — Other Ambulatory Visit (HOSPITAL_COMMUNITY): Payer: Self-pay

## 2023-11-16 DIAGNOSIS — J342 Deviated nasal septum: Secondary | ICD-10-CM | POA: Diagnosis not present

## 2023-11-16 DIAGNOSIS — J343 Hypertrophy of nasal turbinates: Secondary | ICD-10-CM | POA: Diagnosis not present

## 2023-11-16 DIAGNOSIS — J3489 Other specified disorders of nose and nasal sinuses: Secondary | ICD-10-CM | POA: Diagnosis not present

## 2023-11-16 MED ORDER — IPRATROPIUM BROMIDE 0.03 % NA SOLN
2.0000 | Freq: Two times a day (BID) | NASAL | 11 refills | Status: DC | PRN
  Filled 2023-11-16: qty 30, 30d supply, fill #0

## 2023-12-14 ENCOUNTER — Other Ambulatory Visit: Payer: BC Managed Care – PPO

## 2024-01-04 ENCOUNTER — Telehealth: Payer: Self-pay | Admitting: *Deleted

## 2024-01-04 DIAGNOSIS — R972 Elevated prostate specific antigen [PSA]: Secondary | ICD-10-CM

## 2024-01-04 NOTE — Telephone Encounter (Signed)
Copied from CRM [id number]. Topic: Clinical - Request for Lab/Test Order >> Jan 04, 2024  2:12 PM Isabell A wrote: Reason for CRM: Patient called to reschedule labs - no orders on file - still rescheduled. Patient would like to confirm if he needs to be fasting.

## 2024-01-05 NOTE — Telephone Encounter (Signed)
Pt needs CPE labs placed, pt has a lab appointment

## 2024-01-08 NOTE — Addendum Note (Signed)
Addended by: Gershon Crane A on: 01/08/2024 07:58 AM   Modules accepted: Orders

## 2024-01-08 NOTE — Telephone Encounter (Signed)
 Pt.notified

## 2024-01-08 NOTE — Telephone Encounter (Signed)
The PSA has been ordered. No need to fast

## 2024-01-11 ENCOUNTER — Other Ambulatory Visit: Payer: BC Managed Care – PPO

## 2024-01-18 ENCOUNTER — Other Ambulatory Visit (INDEPENDENT_AMBULATORY_CARE_PROVIDER_SITE_OTHER): Payer: BC Managed Care – PPO

## 2024-01-18 DIAGNOSIS — R972 Elevated prostate specific antigen [PSA]: Secondary | ICD-10-CM | POA: Diagnosis not present

## 2024-01-18 LAB — PSA: PSA: 1.4 ng/mL (ref 0.10–4.00)

## 2024-04-22 ENCOUNTER — Other Ambulatory Visit (HOSPITAL_COMMUNITY): Payer: Self-pay

## 2024-07-20 ENCOUNTER — Encounter (HOSPITAL_COMMUNITY): Payer: Self-pay

## 2024-07-20 ENCOUNTER — Inpatient Hospital Stay (HOSPITAL_COMMUNITY)
Admission: EM | Admit: 2024-07-20 | Discharge: 2024-07-25 | DRG: 448 | Disposition: A | Discharge status: Home Health | Attending: Neurological Surgery | Admitting: Neurological Surgery

## 2024-07-20 ENCOUNTER — Emergency Department (HOSPITAL_COMMUNITY)

## 2024-07-20 DIAGNOSIS — S0081XA Abrasion of other part of head, initial encounter: Secondary | ICD-10-CM | POA: Diagnosis present

## 2024-07-20 DIAGNOSIS — Z86718 Personal history of other venous thrombosis and embolism: Secondary | ICD-10-CM | POA: Diagnosis not present

## 2024-07-20 DIAGNOSIS — M545 Low back pain, unspecified: Secondary | ICD-10-CM | POA: Diagnosis not present

## 2024-07-20 DIAGNOSIS — J341 Cyst and mucocele of nose and nasal sinus: Secondary | ICD-10-CM | POA: Diagnosis not present

## 2024-07-20 DIAGNOSIS — R29890 Loss of height: Secondary | ICD-10-CM | POA: Diagnosis not present

## 2024-07-20 DIAGNOSIS — Y93H9 Activity, other involving exterior property and land maintenance, building and construction: Secondary | ICD-10-CM | POA: Diagnosis not present

## 2024-07-20 DIAGNOSIS — Z8249 Family history of ischemic heart disease and other diseases of the circulatory system: Secondary | ICD-10-CM

## 2024-07-20 DIAGNOSIS — Z79899 Other long term (current) drug therapy: Secondary | ICD-10-CM | POA: Diagnosis not present

## 2024-07-20 DIAGNOSIS — Y92007 Garden or yard of unspecified non-institutional (private) residence as the place of occurrence of the external cause: Secondary | ICD-10-CM | POA: Diagnosis not present

## 2024-07-20 DIAGNOSIS — S32032A Unstable burst fracture of third lumbar vertebra, initial encounter for closed fracture: Principal | ICD-10-CM | POA: Diagnosis present

## 2024-07-20 DIAGNOSIS — W11XXXA Fall on and from ladder, initial encounter: Secondary | ICD-10-CM | POA: Diagnosis not present

## 2024-07-20 DIAGNOSIS — Z981 Arthrodesis status: Secondary | ICD-10-CM

## 2024-07-20 DIAGNOSIS — S0093XA Contusion of unspecified part of head, initial encounter: Secondary | ICD-10-CM

## 2024-07-20 DIAGNOSIS — S199XXA Unspecified injury of neck, initial encounter: Secondary | ICD-10-CM | POA: Diagnosis not present

## 2024-07-20 DIAGNOSIS — S32039A Unspecified fracture of third lumbar vertebra, initial encounter for closed fracture: Secondary | ICD-10-CM | POA: Diagnosis present

## 2024-07-20 DIAGNOSIS — M40204 Unspecified kyphosis, thoracic region: Secondary | ICD-10-CM | POA: Diagnosis not present

## 2024-07-20 DIAGNOSIS — S0003XA Contusion of scalp, initial encounter: Secondary | ICD-10-CM | POA: Diagnosis not present

## 2024-07-20 DIAGNOSIS — S0990XA Unspecified injury of head, initial encounter: Secondary | ICD-10-CM | POA: Diagnosis not present

## 2024-07-20 DIAGNOSIS — S3991XA Unspecified injury of abdomen, initial encounter: Secondary | ICD-10-CM | POA: Diagnosis not present

## 2024-07-20 DIAGNOSIS — S32031A Stable burst fracture of third lumbar vertebra, initial encounter for closed fracture: Secondary | ICD-10-CM | POA: Diagnosis not present

## 2024-07-20 DIAGNOSIS — M48061 Spinal stenosis, lumbar region without neurogenic claudication: Secondary | ICD-10-CM | POA: Diagnosis not present

## 2024-07-20 DIAGNOSIS — E785 Hyperlipidemia, unspecified: Secondary | ICD-10-CM | POA: Diagnosis not present

## 2024-07-20 DIAGNOSIS — I10 Essential (primary) hypertension: Secondary | ICD-10-CM | POA: Diagnosis present

## 2024-07-20 DIAGNOSIS — S32001A Stable burst fracture of unspecified lumbar vertebra, initial encounter for closed fracture: Principal | ICD-10-CM

## 2024-07-20 DIAGNOSIS — S0181XA Laceration without foreign body of other part of head, initial encounter: Secondary | ICD-10-CM | POA: Diagnosis not present

## 2024-07-20 DIAGNOSIS — S299XXA Unspecified injury of thorax, initial encounter: Secondary | ICD-10-CM | POA: Diagnosis not present

## 2024-07-20 DIAGNOSIS — S3993XA Unspecified injury of pelvis, initial encounter: Secondary | ICD-10-CM | POA: Diagnosis not present

## 2024-07-20 LAB — COMPREHENSIVE METABOLIC PANEL WITH GFR
ALT: 81 U/L — ABNORMAL HIGH (ref 0–44)
AST: 202 U/L — ABNORMAL HIGH (ref 15–41)
Albumin: 3.5 g/dL (ref 3.5–5.0)
Alkaline Phosphatase: 76 U/L (ref 38–126)
Anion gap: 7 (ref 5–15)
BUN: 22 mg/dL (ref 8–23)
CO2: 25 mmol/L (ref 22–32)
Calcium: 9.1 mg/dL (ref 8.9–10.3)
Chloride: 110 mmol/L (ref 98–111)
Creatinine, Ser: 1.39 mg/dL — ABNORMAL HIGH (ref 0.61–1.24)
GFR, Estimated: 56 mL/min — ABNORMAL LOW (ref >60)
Glucose, Bld: 94 mg/dL (ref 70–99)
Potassium: 4.1 mmol/L (ref 3.5–5.1)
Sodium: 142 mmol/L (ref 135–145)
Total Bilirubin: 1 mg/dL (ref 0.0–1.2)
Total Protein: 6 g/dL — ABNORMAL LOW (ref 6.5–8.1)

## 2024-07-20 LAB — CBC
HCT: 45.2 % (ref 39.0–52.0)
Hemoglobin: 15.1 g/dL (ref 13.0–17.0)
MCH: 29.3 pg (ref 26.0–34.0)
MCHC: 33.4 g/dL (ref 30.0–36.0)
MCV: 87.6 fL (ref 80.0–100.0)
Platelets: 215 K/uL (ref 150–400)
RBC: 5.16 MIL/uL (ref 4.22–5.81)
RDW: 11.9 % (ref 11.5–15.5)
WBC: 7.8 K/uL (ref 4.0–10.5)
nRBC: 0 % (ref 0.0–0.2)

## 2024-07-20 LAB — I-STAT CHEM 8, ED
BUN: 25 mg/dL — ABNORMAL HIGH (ref 8–23)
Calcium, Ion: 1.22 mmol/L (ref 1.15–1.40)
Chloride: 108 mmol/L (ref 98–111)
Creatinine, Ser: 1.4 mg/dL — ABNORMAL HIGH (ref 0.61–1.24)
Glucose, Bld: 94 mg/dL (ref 70–99)
HCT: 43 % (ref 39.0–52.0)
Hemoglobin: 14.6 g/dL (ref 13.0–17.0)
Potassium: 4.1 mmol/L (ref 3.5–5.1)
Sodium: 143 mmol/L (ref 135–145)
TCO2: 23 mmol/L (ref 22–32)

## 2024-07-20 LAB — ETHANOL: Alcohol, Ethyl (B): <15 mg/dL (ref <15)

## 2024-07-20 LAB — I-STAT CG4 LACTIC ACID, ED: Lactic Acid, Venous: 1.4 mmol/L (ref 0.5–1.9)

## 2024-07-20 MED ORDER — POLYETHYLENE GLYCOL 3350 17 G PO PACK
17.0000 g | PACK | Freq: Every day | ORAL | Status: DC | PRN
  Filled 2024-07-20: qty 1

## 2024-07-20 MED ORDER — DOCUSATE SODIUM 100 MG PO CAPS
100.0000 mg | ORAL_CAPSULE | Freq: Two times a day (BID) | ORAL | Status: DC
  Administered 2024-07-20 – 2024-07-25 (×9): 100 mg via ORAL
  Filled 2024-07-20 (×9): qty 1

## 2024-07-20 MED ORDER — ONDANSETRON HCL 4 MG/2ML IJ SOLN
4.0000 mg | Freq: Once | INTRAMUSCULAR | Status: AC
  Administered 2024-07-20: 4 mg via INTRAVENOUS
  Filled 2024-07-20: qty 2

## 2024-07-20 MED ORDER — ONDANSETRON 4 MG PO TBDP
4.0000 mg | ORAL_TABLET | Freq: Four times a day (QID) | ORAL | Status: DC | PRN
  Administered 2024-07-22: 4 mg via ORAL
  Filled 2024-07-20: qty 1

## 2024-07-20 MED ORDER — ENOXAPARIN SODIUM 30 MG/0.3ML IJ SOSY
30.0000 mg | PREFILLED_SYRINGE | Freq: Two times a day (BID) | INTRAMUSCULAR | Status: DC
  Administered 2024-07-21 – 2024-07-24 (×6): 30 mg via SUBCUTANEOUS
  Filled 2024-07-20 (×6): qty 0.3

## 2024-07-20 MED ORDER — HYDROCODONE-ACETAMINOPHEN 5-325 MG PO TABS
1.0000 | ORAL_TABLET | ORAL | Status: DC | PRN
  Administered 2024-07-21 (×3): 1 via ORAL
  Filled 2024-07-20 (×4): qty 1

## 2024-07-20 MED ORDER — MORPHINE SULFATE (PF) 4 MG/ML IV SOLN
4.0000 mg | Freq: Once | INTRAVENOUS | Status: AC
  Administered 2024-07-20: 4 mg via INTRAVENOUS
  Filled 2024-07-20: qty 1

## 2024-07-20 MED ORDER — METHOCARBAMOL 500 MG PO TABS
500.0000 mg | ORAL_TABLET | Freq: Three times a day (TID) | ORAL | Status: AC
  Administered 2024-07-20 – 2024-07-23 (×9): 500 mg via ORAL
  Filled 2024-07-20 (×9): qty 1

## 2024-07-20 MED ORDER — OXYCODONE HCL 5 MG PO TABS
10.0000 mg | ORAL_TABLET | ORAL | Status: DC | PRN
  Administered 2024-07-21 – 2024-07-25 (×9): 10 mg via ORAL
  Filled 2024-07-20 (×11): qty 2

## 2024-07-20 MED ORDER — IOHEXOL 350 MG/ML SOLN
75.0000 mL | Freq: Once | INTRAVENOUS | Status: AC | PRN
  Administered 2024-07-20: 75 mL via INTRAVENOUS

## 2024-07-20 MED ORDER — METHOCARBAMOL 1000 MG/10ML IJ SOLN: 500.0000 mg | Freq: Three times a day (TID) | INTRAMUSCULAR | Status: AC

## 2024-07-20 MED ORDER — ATENOLOL 25 MG PO TABS
50.0000 mg | ORAL_TABLET | Freq: Every day | ORAL | Status: DC
  Administered 2024-07-21 – 2024-07-25 (×4): 50 mg via ORAL
  Filled 2024-07-20 (×4): qty 2

## 2024-07-20 MED ORDER — ACETAMINOPHEN 500 MG PO TABS
1000.0000 mg | ORAL_TABLET | Freq: Four times a day (QID) | ORAL | Status: DC
  Administered 2024-07-21 (×3): 1000 mg via ORAL
  Administered 2024-07-22: 500 mg via ORAL
  Administered 2024-07-22 – 2024-07-23 (×4): 1000 mg via ORAL
  Filled 2024-07-20 (×8): qty 2

## 2024-07-20 MED ORDER — ONDANSETRON HCL 4 MG/2ML IJ SOLN
4.0000 mg | Freq: Four times a day (QID) | INTRAMUSCULAR | Status: DC | PRN
Start: 2024-07-20 — End: 2024-07-24

## 2024-07-20 NOTE — ED Notes (Signed)
 Neuro surgery at bedside.

## 2024-07-20 NOTE — ED Provider Notes (Signed)
 [place] EMERGENCY DEPARTMENT AT [facility] Provider Note   CSN: [id number] Arrival date & time: 07/20/24  1626     Patient presents with: No chief complaint on file.   Daniel Foster is a 63 y.o. male.   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  He does not always take Xarelto , but takes it when he travels and he recently traveled, so he took it yesterday.  None today.  Pt was cutting a tree and fell 20-25 feet.  He landed on his feet, then fell back.  He did hit his head.  He has low back pain.  No loc.  He did sustain a small cut to his posterior head.       Prior to Admission medications   Medication Sig Start Date End Date Taking? Authorizing Provider  atenolol  (TENORMIN ) 50 MG tablet Take 1 tablet (50 mg total) by mouth daily. 08/17/23   Johnny Garnette LABOR, MD    Allergies: Patient has no known allergies.    Review of Systems  Musculoskeletal:  Positive for back pain.  Skin:  Positive for wound.  All other systems reviewed and are negative.   Updated Vital Signs BP 118/80   Pulse 67   Resp 15   Ht 5' 10 (1.778 m)   Wt 81.2 kg   SpO2 94%   BMI 25.68 kg/m   Physical Exam Vitals and nursing note reviewed.  Constitutional:      Appearance: Normal appearance.  HENT:     Head: Normocephalic.     Comments: Facial abrasion forehead.  Hematoma right side of head    Right Ear: External ear normal.     Left Ear: External ear normal.     Nose: Nose normal.     Mouth/Throat:     Mouth: Mucous membranes are moist.     Pharynx: Oropharynx is clear.  Eyes:     Extraocular Movements: Extraocular movements intact.     Conjunctiva/sclera: Conjunctivae normal.     Pupils: Pupils are equal, round, and reactive to light.  Cardiovascular:     Rate and Rhythm: Normal rate and regular rhythm.     Pulses: Normal pulses.     Heart sounds: Normal heart sounds.  Pulmonary:     Effort: Pulmonary effort  is normal.     Breath sounds: Normal breath sounds.  Abdominal:     General: Abdomen is flat. Bowel sounds are normal.     Palpations: Abdomen is soft.  Musculoskeletal:     Cervical back: Normal range of motion and neck supple.       Legs:  Skin:    General: Skin is warm.     Capillary Refill: Capillary refill takes less than 2 seconds.  Neurological:     General: No focal deficit present.     Mental Status: He is alert and oriented to person, place, and time.  Psychiatric:        Mood and Affect: Mood normal.        Behavior: Behavior normal.     (all labs ordered are listed, but only abnormal results are displayed) Labs Reviewed  COMPREHENSIVE METABOLIC PANEL WITH GFR - Abnormal; Notable for the following components:      Result Value   Creatinine, Ser 1.39 (*)    Total Protein 6.0 (*)    AST 202 (*)    ALT 81 (*)  GFR, Estimated 56 (*)    All other components within normal limits  I-STAT CHEM 8, ED - Abnormal; Notable for the following components:   BUN 25 (*)    Creatinine, Ser 1.40 (*)    All other components within normal limits  CBC  ETHANOL  URINALYSIS, ROUTINE W REFLEX MICROSCOPIC  I-STAT CG4 LACTIC ACID, ED    EKG: None  Radiology: CT CHEST ABDOMEN PELVIS W CONTRAST Result Date: 07/20/2024 CLINICAL DATA:  Blunt trauma EXAM: CT CHEST, ABDOMEN, AND PELVIS WITH CONTRAST TECHNIQUE: Multidetector CT imaging of the chest, abdomen and pelvis was performed following the standard protocol during bolus administration of intravenous contrast. RADIATION DOSE REDUCTION: This exam was performed according to the departmental dose-optimization program which includes automated exposure control, adjustment of the mA and/or kV according to patient size and/or use of iterative reconstruction technique. CONTRAST:  75mL OMNIPAQUE  IOHEXOL  350 MG/ML SOLN COMPARISON:  None Available. FINDINGS: CT CHEST FINDINGS Cardiovascular: Mild aortic atherosclerosis. No aneurysm. Normal cardiac  size. No pericardial effusion. Mild coronary vascular calcification. Mediastinum/Nodes: Patent trachea. No thyroid  mass. No suspicious lymph nodes. Esophagus within normal limits. Lungs/Pleura: No acute airspace disease, pleural effusion, or pneumothorax. Musculoskeletal: Sternum appears intact. No evidence for acute rib fracture. See separately dictated thoracic CT CT ABDOMEN PELVIS FINDINGS Hepatobiliary: No calcified gallstone. No biliary dilatation. No focal hepatic abnormality. Pancreas: Unremarkable. No pancreatic ductal dilatation or surrounding inflammatory changes. Spleen: Normal in size without focal abnormality. Adrenals/Urinary Tract: Adrenal glands are within normal limits. Kidneys show no hydronephrosis. Cyst in the right kidney, no imaging follow-up is recommended. Small nonobstructing right kidney stone. The bladder is normal. Stomach/Bowel: The stomach is nonenlarged. There is no dilated small bowel. No acute bowel wall thickening. Vascular/Lymphatic: Mild aortic atherosclerosis. No aneurysm. 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 No aneurysmal dilatation at this level. No surrounding hematoma or fat stranding. Reproductive: Prostate is unremarkable. Other: Negative for pelvic effusion or free air. Musculoskeletal: Acute L3 fracture, see separately dictated spine CT. No evidence for a pelvic fracture. IMPRESSION: 1. No CT evidence for acute intrathoracic abnormality. 2. 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 Other consideration could include short segment focal dissection. No aneurysmal dilatation, no evidence for Peri aortic stranding or Peri aortic hematoma. Findings could be secondary to chronic penetrating aortic ulcer however this is noted to be at the level  of lumbar spine fracture and IMA origin. Although this appearance would be unusual for an acute aortic injury, given adjacent lumbar spine fracture, consider short-term follow-up CT angiography to ensure no progression. 3. Aortic atherosclerosis. Critical Value/emergent results were called by telephone at the time of interpretation on 07/20/2024 at 6:03 pm to provider Tynia Wiers , who verbally acknowledged these results. Aortic Atherosclerosis (ICD10-I70.0). Electronically Signed   By: Luke Bun M.D.   On: 07/20/2024 18:03   CT T-SPINE NO CHARGE Result Date: 07/20/2024 CLINICAL DATA:  Trauma.  Fall from 20-25 feet while cutting a tree. EXAM: CT THORACIC AND LUMBAR SPINE WITH CONTRAST TECHNIQUE: Multiplanar CT images of the thoracic and lumbar spine were reconstructed from contemporary CT of the Chest, Abdomen, and Pelvis. RADIATION DOSE REDUCTION: This exam was performed according to the departmental dose-optimization program which includes automated exposure control, adjustment of the mA and/or kV according to patient size and/or use of iterative reconstruction technique.  CONTRAST:  No additional. COMPARISON:  CT abdomen and pelvis 08/25/2021 FINDINGS: CT THORACIC SPINE FINDINGS Alignment: Mild-to-moderate upper thoracic levoscoliosis and mildly increased upper thoracic kyphosis. No listhesis. Vertebrae: No acute fracture or suspicious lesion. Paraspinal and other soft tissues: No acute abnormality in the paraspinal soft tissues. Main der of the chest reported separately. Disc levels: Mild thoracic spondylosis. No evidence of high-grade spinal canal stenosis. Facet arthrosis which is asymmetrically advanced on the right at T4-5 greater than T5-6 with mild right neural foraminal stenosis at the former. CT LUMBAR SPINE FINDINGS Segmentation: 5 lumbar type vertebrae. Alignment: No significant listhesis. Vertebrae: Acute burst fracture of the L3 vertebral body with 60% height loss centrally and 8 mm of  retropulsion. No posterior element fracture. Unchanged mild chronic L1 superior endplate compression fracture. Paraspinal and other soft tissues: Mild paravertebral soft tissue swelling or hematoma at L3. Remainder of the abdomen and pelvis reported separately. Disc levels: L3 retropulsion results in severe spinal stenosis (6 mm AP spinal canal diameter). Mild spondylosis elsewhere. Disc bulging and facet and ligamentum flavum hypertrophy at L4-5 result in potentially moderate spinal stenosis. IMPRESSION: 1. Acute L3 burst fracture with 60% height loss and 8 mm of retropulsion resulting in severe spinal stenosis. 2. No acute fracture in the thoracic spine. Electronically Signed   By: Dasie Hamburg M.D.   On: 07/20/2024 17:50   CT L-SPINE NO CHARGE Result Date: 07/20/2024 CLINICAL DATA:  Trauma.  Fall from 20-25 feet while cutting a tree. EXAM: CT THORACIC AND LUMBAR SPINE WITH CONTRAST TECHNIQUE: Multiplanar CT images of the thoracic and lumbar spine were reconstructed from contemporary CT of the Chest, Abdomen, and Pelvis. RADIATION DOSE REDUCTION: This exam was performed according to the departmental dose-optimization program which includes automated exposure control, adjustment of the mA and/or kV according to patient size and/or use of iterative reconstruction technique. CONTRAST:  No additional. COMPARISON:  CT abdomen and pelvis 08/25/2021 FINDINGS: CT THORACIC SPINE FINDINGS Alignment: Mild-to-moderate upper thoracic levoscoliosis and mildly increased upper thoracic kyphosis. No listhesis. Vertebrae: No acute fracture or suspicious lesion. Paraspinal and other soft tissues: No acute abnormality in the paraspinal soft tissues. Main der of the chest reported separately. Disc levels: Mild thoracic spondylosis. No evidence of high-grade spinal canal stenosis. Facet arthrosis which is asymmetrically advanced on the right at T4-5 greater than T5-6 with mild right neural foraminal stenosis at the former. CT LUMBAR  SPINE FINDINGS Segmentation: 5 lumbar type vertebrae. Alignment: No significant listhesis. Vertebrae: Acute burst fracture of the L3 vertebral body with 60% height loss centrally and 8 mm of retropulsion. No posterior element fracture. Unchanged mild chronic L1 superior endplate compression fracture. Paraspinal and other soft tissues: Mild paravertebral soft tissue swelling or hematoma at L3. Remainder of the abdomen and pelvis reported separately. Disc levels: L3 retropulsion results in severe spinal stenosis (6 mm AP spinal canal diameter). Mild spondylosis elsewhere. Disc bulging and facet and ligamentum flavum hypertrophy at L4-5 result in potentially moderate spinal stenosis. IMPRESSION: 1. Acute L3 burst fracture with 60% height loss and 8 mm of retropulsion resulting in severe spinal stenosis. 2. No acute fracture in the thoracic spine. Electronically Signed   By: Dasie Hamburg M.D.   On: 07/20/2024 17:50   CT HEAD WO CONTRAST Result Date: 07/20/2024 CLINICAL DATA:  Head trauma, moderate-severe; Polytrauma, blunt. Fall from 20-25 feet while cutting a tree. On blood thinners. EXAM: CT HEAD WITHOUT CONTRAST CT CERVICAL SPINE WITHOUT CONTRAST TECHNIQUE: Multidetector CT imaging of the head and cervical  spine was performed following the standard protocol without intravenous contrast. Multiplanar CT image reconstructions of the cervical spine were also generated. RADIATION DOSE REDUCTION: This exam was performed according to the departmental dose-optimization program which includes automated exposure control, adjustment of the mA and/or kV according to patient size and/or use of iterative reconstruction technique. COMPARISON:  Head MRI 09/22/2021 FINDINGS: CT HEAD FINDINGS Brain: There is no evidence of an acute infarct, intracranial hemorrhage, midline shift, or extra-axial fluid collection. Cerebral volume is normal. The ventricles are normal in size. 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 Vascular: No hyperdense vessel. Skull: No acute fracture or suspicious lesion. Sinuses/Orbits: Small mucous retention cyst in the right maxillary sinus. Clear mastoid air cells. Unremarkable orbits. Other: Right occipital scalp hematoma. CT CERVICAL SPINE FINDINGS Alignment: Normal. Skull base and vertebrae: No acute fracture or suspicious lesion. Soft tissues and spinal canal: No prevertebral fluid or swelling. No visible canal hematoma. Disc levels: Mild cervical spondylosis. No evidence of high-grade spinal canal stenosis. Upper chest: More fully evaluated on the separately reported contemporaneous CT of the chest, abdomen, and pelvis. Other: None. IMPRESSION: 1. No evidence of acute intracranial abnormality or cervical spine fracture. 2. Right occipital scalp hematoma. 3. Known right-sided vestibular schwannoma. Electronically Signed   By: Dasie Hamburg M.D.   On: 07/20/2024 17:41   CT CERVICAL SPINE WO CONTRAST Result Date: 07/20/2024 CLINICAL DATA:  Head trauma, moderate-severe; Polytrauma, blunt. Fall from 20-25 feet while cutting a tree. On blood thinners. EXAM: CT HEAD WITHOUT CONTRAST CT CERVICAL SPINE WITHOUT CONTRAST TECHNIQUE: Multidetector CT imaging of the head and cervical spine was performed following the standard protocol without intravenous contrast. Multiplanar CT image reconstructions of the cervical spine were also generated. RADIATION DOSE REDUCTION: This exam was performed according to the departmental dose-optimization program which includes automated exposure control, adjustment of the mA and/or kV according to patient size and/or use of iterative reconstruction technique. COMPARISON:  Head MRI 09/22/2021 FINDINGS: CT HEAD FINDINGS Brain: There is no evidence of an acute infarct, intracranial hemorrhage, midline shift, or  extra-axial fluid collection. Cerebral volume is normal. The ventricles are normal in size. 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 Vascular: No hyperdense vessel. Skull: No acute fracture or suspicious lesion. Sinuses/Orbits: Small mucous retention cyst in the right maxillary sinus. Clear mastoid air cells. Unremarkable orbits. Other: Right occipital scalp hematoma. CT CERVICAL SPINE FINDINGS Alignment: Normal. Skull base and vertebrae: No acute fracture or suspicious lesion. Soft tissues and spinal canal: No prevertebral fluid or swelling. No visible canal hematoma. Disc levels: Mild cervical spondylosis. No evidence of high-grade spinal canal stenosis. Upper chest: More fully evaluated on the separately reported contemporaneous CT of the chest, abdomen, and pelvis. Other: None. IMPRESSION: 1. No evidence of acute intracranial abnormality or cervical spine fracture. 2. Right occipital scalp hematoma. 3. Known right-sided vestibular schwannoma. Electronically Signed   By: Dasie Hamburg M.D.   On: 07/20/2024 17:41   DG Pelvis Portable Result Date: 07/20/2024 CLINICAL DATA:  Trauma EXAM: PORTABLE PELVIS 1-2 VIEWS COMPARISON:  None Available. FINDINGS: There is no evidence of pelvic fracture or diastasis. No pelvic bone lesions are seen. IMPRESSION: No fracture or dislocation.  Electronically Signed   By: Jackquline Boxer M.D.   On: 07/20/2024 17:00   DG Chest Port 1 View Result Date: 07/20/2024 CLINICAL DATA:  Trauma EXAM: PORTABLE CHEST 1 VIEW COMPARISON:  None Available. FINDINGS: Normal mediastinum and cardiac silhouette. Normal pulmonary vasculature. No evidence of effusion, infiltrate, or pneumothorax. No acute bony abnormality. IMPRESSION: No acute cardiopulmonary process. Electronically Signed   By: Jackquline Boxer M.D.   On:  07/20/2024 16:59     .Laceration Repair  Date/Time: 07/20/2024 7:48 PM  Performed by: Dean Clarity, MD Authorized by: Dean Clarity, MD   Consent:    Consent obtained:  Verbal   Consent given by:  Patient Universal protocol:    Patient identity confirmed:  Verbally with patient Anesthesia:    Anesthesia method:  None Laceration details:    Location:  Face   Face location:  Forehead   Length (cm):  1 Treatment:    Irrigation solution:  Sterile saline Skin repair:    Repair method:  Tissue adhesive Approximation:    Approximation:  Close Repair type:    Repair type:  Simple Post-procedure details:    Procedure completion:  Tolerated well, no immediate complications Comments:     Pt has no tissue to bring together.  The abrasion continued to ooze, so dermabond was applied.    Medications Ordered in the ED  morphine  (PF) 4 MG/ML injection 4 mg (4 mg Intravenous Given 07/20/24 1644)  ondansetron  (ZOFRAN ) injection 4 mg (4 mg Intravenous Given 07/20/24 1644)  iohexol  (OMNIPAQUE ) 350 MG/ML injection 75 mL (75 mLs Intravenous Contrast Given 07/20/24 1732)  morphine  (PF) 4 MG/ML injection 4 mg (4 mg Intravenous Given 07/20/24 1833)                                    Medical Decision Making Amount and/or Complexity of Data Reviewed Labs: ordered. Radiology: ordered.  Risk Prescription drug management. Decision regarding hospitalization.   This patient presents to the ED for concern of fall, this involves an extensive number of treatment options, and is a complaint that carries with it a high risk of complications and morbidity.  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  The pertinent results include:  cbc nl, cmp nl other than cr sl elevated at 1.39; ast 202 and alt 81 (nl in Sept); lactic nl; etoh neg   Imaging Studies ordered:  I ordered imaging studies including cxr, pelvic xr, ct head/neck, chest/abd/pelvis, back  I independently visualized and interpreted imaging which showed  CXR: No acute cardiopulmonary process.  Pelvis: No fracture or dislocation.  CT head/c-spine: . No evidence of acute intracranial abnormality or cervical spine  fracture.  2. Right occipital scalp hematoma.  3. Known right-sided vestibular schwannoma.  CT chest/abd/pelvis:  . No CT evidence for acute intrathoracic abnormality.  2. Partial linear filling defect within the infrarenal abdominal  aorta, appearance on coronal views suggest possible penetrating  aortic ulcer. Other consideration could include short segment focal  dissection. No aneurysmal dilatation, no evidence for Peri aortic  stranding or Peri aortic hematoma. Findings could be secondary  to  chronic penetrating aortic ulcer however this is noted to be at the  level of lumbar spine fracture and IMA origin. Although this  appearance would be unusual for an acute aortic injury, given  adjacent lumbar spine fracture, consider short-term follow-up CT  angiography to ensure no progression.  3. Aortic atherosclerosis.   CT lumbar and thoracic spine: . Acute L3 burst fracture with 60% height loss and 8 mm of  retropulsion resulting in severe spinal stenosis.  2. No acute fracture in the thoracic spine.    I agree with the radiologist interpretation   Cardiac Monitoring:  The patient was maintained on a cardiac monitor.  I personally viewed and interpreted the cardiac monitored which showed an underlying rhythm of: nsr   Medicines ordered and prescription drug management:  I ordered medication including morphine /zofran   for sx   Reevaluation of the patient after these medicines showed that the patient improved I have reviewed the patients home medicines and have made adjustments as needed   Test Considered:  ct   Critical Interventions:  Level 2 trauma   Consultations Obtained:  I requested consultation with the NS (Meyram),  and discussed lab and imaging findings as well as pertinent plan - they will admit   Problem List / ED Course:  Fall with L3 burst fx:  pt d/w NS who recommend admission and surgical repair.  Due to the recent Xarelto , he can't be operated upon until Wed (8/20).  He will be admitted for pain control and bed rest.   Reevaluation:  After the interventions noted above, I reevaluated the patient and found that they have :improved   Social Determinants of Health:  Lives at home   Dispostion:  After consideration of the diagnostic results and the patients response to treatment, I feel that the patent would benefit from admission.       Final diagnoses:  Burst fracture of lumbar vertebra, closed, initial encounter (HCC)  Abrasion of face, initial encounter  Traumatic cephalohematoma, initial encounter    ED Discharge Orders     None          Dean Clarity, MD 07/20/24 1952

## 2024-07-20 NOTE — Progress Notes (Signed)
 Trauma Response Nurse Documentation   Daniel Foster is a 63 y.o. male arriving to [facility] ED via EMS  On Xarelto  (rivaroxaban ) daily. Trauma was activated as a Level 2 by charge nurse based on the following trauma criteria Falls > 20 ft. with adults, >10 ft. with children (<15). Trauma team at the bedside on patient arrival.   Patient cleared for CT by Dr. Dean. Pt transported to CT with trauma response nurse present to monitor. RN remained with the patient throughout their absence from the department for clinical observation.   GCS 15.  History   Past Medical History:  Diagnosis Date   Chest pain    DVT (deep venous thrombosis) (HCC)    Headache    Hyperlipidemia    Hypertension    Kidney stone    Passed X2 (last one 1981)   Right-sided acoustic neuroma (HCC)    sees Dr. Medford Angle     Past Surgical History:  Procedure Laterality Date   COLONOSCOPY  01/2021   per Dr. Burnette, adenomatous polyps, repeat in 3 yrs   CYSTOSCOPY  1981   KNEE SURGERY  2004   Arthroscopic surgery Left knee for torn Meniscus   OTHER SURGICAL HISTORY  12/16/2021   Gamma knife procedure per Dr. Garnette Bouquet of [facility] for right acoustic neuroma   TONSILLECTOMY  1966   WRIST FRACTURE SURGERY  03/2009       Initial Focused Assessment (If applicable, or please see trauma documentation):   CT's Completed:   CT abdomen/pelvis w/ contrast , CT head, C spine  Interventions:   Plan for disposition:  Admission to floor   Consults completed:  Neurosurgeon at 1803, Jones/Meyran arrived 1932  Event Summary: Pt arrived via [facility] from home, pt reports he was @ 37ft in the air cutting a tree, tree unexpectedly split causing him to fall to the ground, pt landed on ground on feet, then struck head on ground. Denies LOC. Pt A & O, GCS 15 ccollar and IV est by EMS. Pt c/o pain to low back. Abrasion noted to forehead. CMS intact. Labs drawn, PCXR and Pelvis done, pt transported  to CT on CCM, spinal precautions maintained during transport as well and transfer to CT table. Pt reports he did have compression fx in 80's, did not have surgical intervention. Upon return family at bedside.  Pt reports he does take Xarelto  PRN when taking trips since developing a DVT on a flight from [place], took it yesterday due to traveling to [place].  NSY consulted for admission of L3 burst fx  MTP Summary (If applicable): N/A  Bedside handoff with ED RN Leita.    Nani Ingram Dee  Trauma Response RN  Please call TRN at [phone] for further assistance.

## 2024-07-20 NOTE — ED Triage Notes (Signed)
 Pt BIB [facility] as level 2 fall on thinners. Pt fell from 20-25 feet cutting a tree. Pt fell on feet first and then fell back and hit his head, bleeding controlled, takes xarelto , last took yesterday. Pt A/O, GCS 15. Denies LOC  130/72 Hr 68

## 2024-07-20 NOTE — H&P (Signed)
 Daniel Foster is an 63 y.o. male.   HPI:  63 year old male presented to the ED today after falling off of a ladder when cutting a tree. He thinks that he landed on his feet. Has a history of a compression fracture in 1990. He take xarelto  only when he is traveling. He last took is yesterday morning. He reports back pain but no numbness tingling pain or weakness in his legs. Denies any change in his bowel or bladder habits.   Past Medical History:  Diagnosis Date   Chest pain    DVT (deep venous thrombosis) (HCC)    Headache    Hyperlipidemia    Hypertension    Kidney stone    Passed X2 (last one 1981)   Right-sided acoustic neuroma (HCC)    sees Dr. Medford Angle    Past Surgical History:  Procedure Laterality Date   COLONOSCOPY  01/2021   per Dr. Burnette, adenomatous polyps, repeat in 3 yrs   CYSTOSCOPY  1981   KNEE SURGERY  2004   Arthroscopic surgery Left knee for torn Meniscus   OTHER SURGICAL HISTORY  12/16/2021   Gamma knife procedure per Dr. Garnette Bouquet of [facility] for right acoustic neuroma   TONSILLECTOMY  1966   WRIST FRACTURE SURGERY  03/2009    No Known Allergies  Social History   Tobacco Use   Smoking status: Never   Smokeless tobacco: Never  Substance Use Topics   Alcohol use: Not on file    Family History  Problem Relation Age of Onset   Heart attack Father      Review of Systems  Positive ROS: as above  All other systems have been reviewed and were otherwise negative with the exception of those mentioned in the HPI and as above.  Objective: Vital signs in last 24 hours: Pulse Rate:  [61-67] 67 (08/16 1845) Resp:  [15-16] 15 (08/16 1845) BP: (118-128)/(72-82) 118/80 (08/16 1845) SpO2:  [94 %-96 %] 94 % (08/16 1845) Weight:  [81.2 kg] 81.2 kg (08/16 1648)  General Appearance: Alert, cooperative, no distress, appears stated age Head: Normocephalic, without obvious abnormality, atraumatic Eyes: PERRL, conjunctiva/corneas  clear, EOM's intact, fundi benign, both eyes      Back: Symmetric, no curvature, ROM normal, no CVA tenderness Lungs: respirations unlabored Heart: Regular rate and rhythm Extremities: Extremities normal, atraumatic, no cyanosis or edema Pulses: 2+ and symmetric all extremities Skin: Skin color, texture, turgor normal, no rashes or lesions  NEUROLOGIC:   Mental status: A&O x4, no aphasia, good attention span, Memory and fund of knowledge Motor Exam - grossly normal, normal tone and bulk Sensory Exam - grossly normal Reflexes: symmetric, no pathologic reflexes, No Hoffman's, No clonus Coordination - grossly normal Gait - not tested Balance - not tested Cranial Nerves: I: smell Not tested  II: visual acuity  OS: na    OD: na  II: visual fields Full to confrontation  II: pupils Equal, round, reactive to light  III,VII: ptosis None  III,IV,VI: extraocular muscles  Full ROM  V: mastication   V: facial light touch sensation    V,VII: corneal reflex    VII: facial muscle function - upper    VII: facial muscle function - lower   VIII: hearing   IX: soft palate elevation    IX,X: gag reflex   XI: trapezius strength    XI: sternocleidomastoid strength   XI: neck flexion strength    XII: tongue strength  Data Review Lab Results  Component Value Date   WBC 7.8 07/20/2024   HGB 14.6 07/20/2024   HCT 43.0 07/20/2024   MCV 87.6 07/20/2024   PLT 215 07/20/2024   Lab Results  Component Value Date   NA 143 07/20/2024   K 4.1 07/20/2024   CL 108 07/20/2024   CO2 25 07/20/2024   BUN 25 (H) 07/20/2024   CREATININE 1.40 (H) 07/20/2024   GLUCOSE 94 07/20/2024   Lab Results  Component Value Date   INR 1.05 09/16/2016    Radiology: CT CHEST ABDOMEN PELVIS W CONTRAST Result Date: 07/20/2024 CLINICAL DATA:  Blunt trauma EXAM: CT CHEST, ABDOMEN, AND PELVIS WITH CONTRAST TECHNIQUE: Multidetector CT imaging of the chest, abdomen and pelvis was performed following the standard  protocol during bolus administration of intravenous contrast. RADIATION DOSE REDUCTION: This exam was performed according to the departmental dose-optimization program which includes automated exposure control, adjustment of the mA and/or kV according to patient size and/or use of iterative reconstruction technique. CONTRAST:  75mL OMNIPAQUE  IOHEXOL  350 MG/ML SOLN COMPARISON:  None Available. FINDINGS: CT CHEST FINDINGS Cardiovascular: Mild aortic atherosclerosis. No aneurysm. Normal cardiac size. No pericardial effusion. Mild coronary vascular calcification. Mediastinum/Nodes: Patent trachea. No thyroid  mass. No suspicious lymph nodes. Esophagus within normal limits. Lungs/Pleura: No acute airspace disease, pleural effusion, or pneumothorax. Musculoskeletal: Sternum appears intact. No evidence for acute rib fracture. See separately dictated thoracic CT CT ABDOMEN PELVIS FINDINGS Hepatobiliary: No calcified gallstone. No biliary dilatation. No focal hepatic abnormality. Pancreas: Unremarkable. No pancreatic ductal dilatation or surrounding inflammatory changes. Spleen: Normal in size without focal abnormality. Adrenals/Urinary Tract: Adrenal glands are within normal limits. Kidneys show no hydronephrosis. Cyst in the right kidney, no imaging follow-up is recommended. Small nonobstructing right kidney stone. The bladder is normal. Stomach/Bowel: The stomach is nonenlarged. There is no dilated small bowel. No acute bowel wall thickening. Vascular/Lymphatic: Mild aortic atherosclerosis. No aneurysm. 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 No aneurysmal dilatation at this level. No surrounding hematoma or fat stranding. Reproductive: Prostate is unremarkable. Other: Negative for pelvic effusion or free air. Musculoskeletal: Acute L3 fracture, see separately dictated spine CT. No  evidence for a pelvic fracture. IMPRESSION: 1. No CT evidence for acute intrathoracic abnormality. 2. 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 Other consideration could include short segment focal dissection. No aneurysmal dilatation, no evidence for Peri aortic stranding or Peri aortic hematoma. Findings could be secondary to chronic penetrating aortic ulcer however this is noted to be at the level of lumbar spine fracture and IMA origin. Although this appearance would be unusual for an acute aortic injury, given adjacent lumbar spine fracture, consider short-term follow-up CT angiography to ensure no progression. 3. Aortic atherosclerosis. Critical Value/emergent results were called by telephone at the time of interpretation on 07/20/2024 at 6:03 pm to provider JULIE HAVILAND , who verbally acknowledged these results. Aortic Atherosclerosis (ICD10-I70.0). Electronically Signed   By: Luke Bun M.D.   On: 07/20/2024 18:03   CT T-SPINE NO CHARGE Result Date: 07/20/2024 CLINICAL DATA:  Trauma.  Fall from 20-25 feet while cutting a tree. EXAM: CT THORACIC AND LUMBAR SPINE WITH CONTRAST TECHNIQUE: Multiplanar CT images of the thoracic and lumbar spine were reconstructed from contemporary CT of the Chest, Abdomen, and Pelvis. RADIATION DOSE REDUCTION: This exam was performed according  to the departmental dose-optimization program which includes automated exposure control, adjustment of the mA and/or kV according to patient size and/or use of iterative reconstruction technique. CONTRAST:  No additional. COMPARISON:  CT abdomen and pelvis 08/25/2021 FINDINGS: CT THORACIC SPINE FINDINGS Alignment: Mild-to-moderate upper thoracic levoscoliosis and mildly increased upper thoracic kyphosis. No listhesis. Vertebrae: No acute fracture or suspicious lesion. Paraspinal and other soft tissues: No acute abnormality in the  paraspinal soft tissues. Main der of the chest reported separately. Disc levels: Mild thoracic spondylosis. No evidence of high-grade spinal canal stenosis. Facet arthrosis which is asymmetrically advanced on the right at T4-5 greater than T5-6 with mild right neural foraminal stenosis at the former. CT LUMBAR SPINE FINDINGS Segmentation: 5 lumbar type vertebrae. Alignment: No significant listhesis. Vertebrae: Acute burst fracture of the L3 vertebral body with 60% height loss centrally and 8 mm of retropulsion. No posterior element fracture. Unchanged mild chronic L1 superior endplate compression fracture. Paraspinal and other soft tissues: Mild paravertebral soft tissue swelling or hematoma at L3. Remainder of the abdomen and pelvis reported separately. Disc levels: L3 retropulsion results in severe spinal stenosis (6 mm AP spinal canal diameter). Mild spondylosis elsewhere. Disc bulging and facet and ligamentum flavum hypertrophy at L4-5 result in potentially moderate spinal stenosis. IMPRESSION: 1. Acute L3 burst fracture with 60% height loss and 8 mm of retropulsion resulting in severe spinal stenosis. 2. No acute fracture in the thoracic spine. Electronically Signed   By: Dasie Hamburg M.D.   On: 07/20/2024 17:50   CT L-SPINE NO CHARGE Result Date: 07/20/2024 CLINICAL DATA:  Trauma.  Fall from 20-25 feet while cutting a tree. EXAM: CT THORACIC AND LUMBAR SPINE WITH CONTRAST TECHNIQUE: Multiplanar CT images of the thoracic and lumbar spine were reconstructed from contemporary CT of the Chest, Abdomen, and Pelvis. RADIATION DOSE REDUCTION: This exam was performed according to the departmental dose-optimization program which includes automated exposure control, adjustment of the mA and/or kV according to patient size and/or use of iterative reconstruction technique. CONTRAST:  No additional. COMPARISON:  CT abdomen and pelvis 08/25/2021 FINDINGS: CT THORACIC SPINE FINDINGS Alignment: Mild-to-moderate upper  thoracic levoscoliosis and mildly increased upper thoracic kyphosis. No listhesis. Vertebrae: No acute fracture or suspicious lesion. Paraspinal and other soft tissues: No acute abnormality in the paraspinal soft tissues. Main der of the chest reported separately. Disc levels: Mild thoracic spondylosis. No evidence of high-grade spinal canal stenosis. Facet arthrosis which is asymmetrically advanced on the right at T4-5 greater than T5-6 with mild right neural foraminal stenosis at the former. CT LUMBAR SPINE FINDINGS Segmentation: 5 lumbar type vertebrae. Alignment: No significant listhesis. Vertebrae: Acute burst fracture of the L3 vertebral body with 60% height loss centrally and 8 mm of retropulsion. No posterior element fracture. Unchanged mild chronic L1 superior endplate compression fracture. Paraspinal and other soft tissues: Mild paravertebral soft tissue swelling or hematoma at L3. Remainder of the abdomen and pelvis reported separately. Disc levels: L3 retropulsion results in severe spinal stenosis (6 mm AP spinal canal diameter). Mild spondylosis elsewhere. Disc bulging and facet and ligamentum flavum hypertrophy at L4-5 result in potentially moderate spinal stenosis. IMPRESSION: 1. Acute L3 burst fracture with 60% height loss and 8 mm of retropulsion resulting in severe spinal stenosis. 2. No acute fracture in the thoracic spine. Electronically Signed   By: Dasie Hamburg M.D.   On: 07/20/2024 17:50   CT HEAD WO CONTRAST Result Date: 07/20/2024 CLINICAL DATA:  Head trauma, moderate-severe; Polytrauma, blunt. Fall from 20-25 feet  while cutting a tree. On blood thinners. EXAM: CT HEAD WITHOUT CONTRAST CT CERVICAL SPINE WITHOUT CONTRAST TECHNIQUE: Multidetector CT imaging of the head and cervical spine was performed following the standard protocol without intravenous contrast. Multiplanar CT image reconstructions of the cervical spine were also generated. RADIATION DOSE REDUCTION: This exam was performed  according to the departmental dose-optimization program which includes automated exposure control, adjustment of the mA and/or kV according to patient size and/or use of iterative reconstruction technique. COMPARISON:  Head MRI 09/22/2021 FINDINGS: CT HEAD FINDINGS Brain: There is no evidence of an acute infarct, intracranial hemorrhage, midline shift, or extra-axial fluid collection. Cerebral volume is normal. The ventricles are normal in size. 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 Vascular: No hyperdense vessel. Skull: No acute fracture or suspicious lesion. Sinuses/Orbits: Small mucous retention cyst in the right maxillary sinus. Clear mastoid air cells. Unremarkable orbits. Other: Right occipital scalp hematoma. CT CERVICAL SPINE FINDINGS Alignment: Normal. Skull base and vertebrae: No acute fracture or suspicious lesion. Soft tissues and spinal canal: No prevertebral fluid or swelling. No visible canal hematoma. Disc levels: Mild cervical spondylosis. No evidence of high-grade spinal canal stenosis. Upper chest: More fully evaluated on the separately reported contemporaneous CT of the chest, abdomen, and pelvis. Other: None. IMPRESSION: 1. No evidence of acute intracranial abnormality or cervical spine fracture. 2. Right occipital scalp hematoma. 3. Known right-sided vestibular schwannoma. Electronically Signed   By: Dasie Hamburg M.D.   On: 07/20/2024 17:41   CT CERVICAL SPINE WO CONTRAST Result Date: 07/20/2024 CLINICAL DATA:  Head trauma, moderate-severe; Polytrauma, blunt. Fall from 20-25 feet while cutting a tree. On blood thinners. EXAM: CT HEAD WITHOUT CONTRAST CT CERVICAL SPINE WITHOUT CONTRAST TECHNIQUE: Multidetector CT imaging of the head and cervical spine was performed following the standard protocol without  intravenous contrast. Multiplanar CT image reconstructions of the cervical spine were also generated. RADIATION DOSE REDUCTION: This exam was performed according to the departmental dose-optimization program which includes automated exposure control, adjustment of the mA and/or kV according to patient size and/or use of iterative reconstruction technique. COMPARISON:  Head MRI 09/22/2021 FINDINGS: CT HEAD FINDINGS Brain: There is no evidence of an acute infarct, intracranial hemorrhage, midline shift, or extra-axial fluid collection. Cerebral volume is normal. The ventricles are normal in size. 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 Vascular: No hyperdense vessel. Skull: No acute fracture or suspicious lesion. Sinuses/Orbits: Small mucous retention cyst in the right maxillary sinus. Clear mastoid air cells. Unremarkable orbits. Other: Right occipital scalp hematoma. CT CERVICAL SPINE FINDINGS Alignment: Normal. Skull base and vertebrae: No acute fracture or suspicious lesion. Soft tissues and spinal canal: No prevertebral fluid or swelling. No visible canal hematoma. Disc levels: Mild cervical spondylosis. No evidence of high-grade spinal canal stenosis. Upper chest: More fully evaluated on the separately reported contemporaneous CT of the chest, abdomen, and pelvis. Other: None. IMPRESSION: 1. No evidence of acute intracranial abnormality or cervical spine fracture. 2. Right occipital scalp hematoma. 3. Known right-sided vestibular schwannoma. Electronically Signed   By: Dasie Hamburg M.D.   On: 07/20/2024 17:41   DG Pelvis Portable Result Date: 07/20/2024 CLINICAL DATA:  Trauma EXAM: PORTABLE PELVIS 1-2  VIEWS COMPARISON:  None Available. FINDINGS: There is no evidence of pelvic fracture or diastasis. No pelvic bone lesions are seen.  IMPRESSION: No fracture or dislocation. Electronically Signed   By: Jackquline Boxer M.D.   On: 07/20/2024 17:00   DG Chest Port 1 View Result Date: 07/20/2024 CLINICAL DATA:  Trauma EXAM: PORTABLE CHEST 1 VIEW COMPARISON:  None Available. FINDINGS: Normal mediastinum and cardiac silhouette. Normal pulmonary vasculature. No evidence of effusion, infiltrate, or pneumothorax. No acute bony abnormality. IMPRESSION: No acute cardiopulmonary process. Electronically Signed   By: Jackquline Boxer M.D.   On: 07/20/2024 16:59     Assessment/Plan: Very pleasant 63 year old male presented to the ED after falling off of a ladder. CT lumbar shows an L3 burst fracture with retropulsion into the canal causing severe spinal stenosis. This is going to need stabilization. Given his xarelto  dose yesterday, we cannot do this until Wednesday. We will put him on bedrest until likely Wednesday when we can hopefully stabilize this. He will need instrumentation from L2-L4. Discussed plan of care with patient and his wife and he understood. Will admit to the floor for pain control and bedrest. Will also start him on lovenox  with his history of DVTs    Suzen Lacks [facility] 07/20/2024 7:37 PM

## 2024-07-20 NOTE — ED Notes (Signed)
 Patient transported to CT with Trauma RN

## 2024-07-20 NOTE — ED Notes (Signed)
 Changed bedding under patient.

## 2024-07-20 NOTE — Progress Notes (Signed)
 Orthopedic Tech Progress Note Patient Details:  Daniel Foster [birth year] [id number]  Level II trauma, no ortho tech orders at this time.  Patient ID: Daniel Foster, male   DOB: [birth year], 63 y.o.   MRN: [id number]  Daniel Foster 07/20/2024, 5:04 PM

## 2024-07-20 NOTE — Progress Notes (Signed)
 Transition of Care [facility]) - CAGE-AID Screening   Patient Details  Name: Daniel Foster MRN: [id number] Date of Birth: [birth year]   MARINDA LIONEL Sora, RN Phone Number: 07/20/2024, 7:32 PM   Clinical Narrative: Denies etoh/drug/tobacco/smokeless nicotine usage. No resources needed.    CAGE-AID Screening:    Have You Ever Felt You Ought to Cut Down on Your Drinking or Drug Use?: No Have People Annoyed You By Critizing Your Drinking Or Drug Use?: No Have You Felt Bad Or Guilty About Your Drinking Or Drug Use?: No Have You Ever Had a Drink or Used Drugs First Thing In The Morning to Steady Your Nerves or to Get Rid of a Hangover?: No CAGE-AID Score: 0  Substance Abuse Education Offered: No

## 2024-07-21 ENCOUNTER — Other Ambulatory Visit: Payer: Self-pay

## 2024-07-21 LAB — CBC
HCT: 43.2 % (ref 39.0–52.0)
Hemoglobin: 14.6 g/dL (ref 13.0–17.0)
MCH: 29.7 pg (ref 26.0–34.0)
MCHC: 33.8 g/dL (ref 30.0–36.0)
MCV: 87.8 fL (ref 80.0–100.0)
Platelets: 202 K/uL (ref 150–400)
RBC: 4.92 MIL/uL (ref 4.22–5.81)
RDW: 12.3 % (ref 11.5–15.5)
WBC: 12 K/uL — ABNORMAL HIGH (ref 4.0–10.5)
nRBC: 0 % (ref 0.0–0.2)

## 2024-07-21 MED ORDER — HYDROMORPHONE HCL 1 MG/ML IJ SOLN
1.0000 mg | INTRAMUSCULAR | Status: DC | PRN
  Administered 2024-07-21 – 2024-07-25 (×10): 1 mg via INTRAVENOUS
  Filled 2024-07-21 (×10): qty 1

## 2024-07-21 NOTE — Plan of Care (Signed)
   Problem: Health Behavior/Discharge Planning: Goal: Ability to manage health-related needs will improve Outcome: Progressing   Problem: Clinical Measurements: Goal: Will remain free from infection Outcome: Progressing   Problem: Clinical Measurements: Goal: Respiratory complications will improve Outcome: Progressing

## 2024-07-21 NOTE — Plan of Care (Signed)
   Problem: Education: Goal: Knowledge of General Education information will improve Description: Including pain rating scale, medication(s)/side effects and non-pharmacologic comfort measures Outcome: Progressing   Problem: Clinical Measurements: Goal: Ability to maintain clinical measurements within normal limits will improve Outcome: Progressing Goal: Will remain free from infection Outcome: Progressing Goal: Diagnostic test results will improve Outcome: Progressing Goal: Respiratory complications will improve Outcome: Progressing Goal: Cardiovascular complication will be avoided Outcome: Progressing   Problem: Nutrition: Goal: Adequate nutrition will be maintained Outcome: Progressing   Problem: Coping: Goal: Level of anxiety will decrease Outcome: Progressing   Problem: Elimination: Goal: Will not experience complications related to bowel motility Outcome: Progressing Goal: Will not experience complications related to urinary retention Outcome: Progressing   Problem: Pain Managment: Goal: General experience of comfort will improve and/or be controlled Outcome: Progressing   Problem: Safety: Goal: Ability to remain free from injury will improve Outcome: Progressing   Problem: Skin Integrity: Goal: Risk for impaired skin integrity will decrease Outcome: Progressing

## 2024-07-21 NOTE — Progress Notes (Signed)
 Pt refused full skin assessment at this time d/t pain.

## 2024-07-21 NOTE — Progress Notes (Signed)
 Patient ID: Daniel Foster, male   DOB: March 21, 1961, 62 y.o.   MRN: [id number] Subjective: Patient reports mild back soreness.  Some soreness with moving in bed.  No leg pain or numbness tingling or weakness.  Objective: Vital signs in last 24 hours: Temp:  [98.2 F (36.8 C)-99 F (37.2 C)] 98.2 F (36.8 C) (08/17 0857) Pulse Rate:  [57-74] 57 (08/17 0857) Resp:  [15-22] 18 (08/17 0857) BP: (103-128)/(69-82) 111/73 (08/17 0857) SpO2:  [92 %-96 %] 92 % (08/17 0857) Weight:  [81.2 kg] 81.2 kg (08/16 1648)  Intake/Output from previous day: 08/16 0701 - 08/17 0700 In: 120 [P.O.:120] Out: -  Intake/Output this shift: No intake/output data recorded.  Neurologic: Grossly normal, moving legs well to in bed exam, he is at flat bedrest  Lab Results: Lab Results  Component Value Date   WBC 12.0 (H) 07/21/2024   HGB 14.6 07/21/2024   HCT 43.2 07/21/2024   MCV 87.8 07/21/2024   PLT 202 07/21/2024   Lab Results  Component Value Date   INR 1.05 09/16/2016   BMET Lab Results  Component Value Date   NA 143 07/20/2024   K 4.1 07/20/2024   CL 108 07/20/2024   CO2 25 07/20/2024   GLUCOSE 94 07/20/2024   BUN 25 (H) 07/20/2024   CREATININE 1.40 (H) 07/20/2024   CALCIUM  9.1 07/20/2024    Studies/Results: CT CHEST ABDOMEN PELVIS W CONTRAST Result Date: 07/20/2024 CLINICAL DATA:  Blunt trauma EXAM: CT CHEST, ABDOMEN, AND PELVIS WITH CONTRAST TECHNIQUE: Multidetector CT imaging of the chest, abdomen and pelvis was performed following the standard protocol during bolus administration of intravenous contrast. RADIATION DOSE REDUCTION: This exam was performed according to the departmental dose-optimization program which includes automated exposure control, adjustment of the mA and/or kV according to patient size and/or use of iterative reconstruction technique. CONTRAST:  75mL OMNIPAQUE  IOHEXOL  350 MG/ML SOLN COMPARISON:  None Available. FINDINGS: CT CHEST FINDINGS Cardiovascular: Mild aortic  atherosclerosis. No aneurysm. Normal cardiac size. No pericardial effusion. Mild coronary vascular calcification. Mediastinum/Nodes: Patent trachea. No thyroid  mass. No suspicious lymph nodes. Esophagus within normal limits. Lungs/Pleura: No acute airspace disease, pleural effusion, or pneumothorax. Musculoskeletal: Sternum appears intact. No evidence for acute rib fracture. See separately dictated thoracic CT CT ABDOMEN PELVIS FINDINGS Hepatobiliary: No calcified gallstone. No biliary dilatation. No focal hepatic abnormality. Pancreas: Unremarkable. No pancreatic ductal dilatation or surrounding inflammatory changes. Spleen: Normal in size without focal abnormality. Adrenals/Urinary Tract: Adrenal glands are within normal limits. Kidneys show no hydronephrosis. Cyst in the right kidney, no imaging follow-up is recommended. Small nonobstructing right kidney stone. The bladder is normal. Stomach/Bowel: The stomach is nonenlarged. There is no dilated small bowel. No acute bowel wall thickening. Vascular/Lymphatic: Mild aortic atherosclerosis. No aneurysm. 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 No aneurysmal dilatation at this level. No surrounding hematoma or fat stranding. Reproductive: Prostate is unremarkable. Other: Negative for pelvic effusion or free air. Musculoskeletal: Acute L3 fracture, see separately dictated spine CT. No evidence for a pelvic fracture. IMPRESSION: 1. No CT evidence for acute intrathoracic abnormality. 2. 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 Other consideration could include short segment focal dissection. No aneurysmal dilatation, no evidence for Peri aortic stranding or Peri aortic hematoma. Findings could be secondary to chronic penetrating aortic  ulcer however this is noted to  be at the level of lumbar spine fracture and IMA origin. Although this appearance would be unusual for an acute aortic injury, given adjacent lumbar spine fracture, consider short-term follow-up CT angiography to ensure no progression. 3. Aortic atherosclerosis. Critical Value/emergent results were called by telephone at the time of interpretation on 07/20/2024 at 6:03 pm to provider JULIE HAVILAND , who verbally acknowledged these results. Aortic Atherosclerosis (ICD10-I70.0). Electronically Signed   By: Luke Bun M.D.   On: 07/20/2024 18:03   CT T-SPINE NO CHARGE Result Date: 07/20/2024 CLINICAL DATA:  Trauma.  Fall from 20-25 feet while cutting a tree. EXAM: CT THORACIC AND LUMBAR SPINE WITH CONTRAST TECHNIQUE: Multiplanar CT images of the thoracic and lumbar spine were reconstructed from contemporary CT of the Chest, Abdomen, and Pelvis. RADIATION DOSE REDUCTION: This exam was performed according to the departmental dose-optimization program which includes automated exposure control, adjustment of the mA and/or kV according to patient size and/or use of iterative reconstruction technique. CONTRAST:  No additional. COMPARISON:  CT abdomen and pelvis 08/25/2021 FINDINGS: CT THORACIC SPINE FINDINGS Alignment: Mild-to-moderate upper thoracic levoscoliosis and mildly increased upper thoracic kyphosis. No listhesis. Vertebrae: No acute fracture or suspicious lesion. Paraspinal and other soft tissues: No acute abnormality in the paraspinal soft tissues. Main der of the chest reported separately. Disc levels: Mild thoracic spondylosis. No evidence of high-grade spinal canal stenosis. Facet arthrosis which is asymmetrically advanced on the right at T4-5 greater than T5-6 with mild right neural foraminal stenosis at the former. CT LUMBAR SPINE FINDINGS Segmentation: 5 lumbar type vertebrae. Alignment: No significant listhesis. Vertebrae: Acute burst fracture of the L3 vertebral body  with 60% height loss centrally and 8 mm of retropulsion. No posterior element fracture. Unchanged mild chronic L1 superior endplate compression fracture. Paraspinal and other soft tissues: Mild paravertebral soft tissue swelling or hematoma at L3. Remainder of the abdomen and pelvis reported separately. Disc levels: L3 retropulsion results in severe spinal stenosis (6 mm AP spinal canal diameter). Mild spondylosis elsewhere. Disc bulging and facet and ligamentum flavum hypertrophy at L4-5 result in potentially moderate spinal stenosis. IMPRESSION: 1. Acute L3 burst fracture with 60% height loss and 8 mm of retropulsion resulting in severe spinal stenosis. 2. No acute fracture in the thoracic spine. Electronically Signed   By: Dasie Hamburg M.D.   On: 07/20/2024 17:50   CT L-SPINE NO CHARGE Result Date: 07/20/2024 CLINICAL DATA:  Trauma.  Fall from 20-25 feet while cutting a tree. EXAM: CT THORACIC AND LUMBAR SPINE WITH CONTRAST TECHNIQUE: Multiplanar CT images of the thoracic and lumbar spine were reconstructed from contemporary CT of the Chest, Abdomen, and Pelvis. RADIATION DOSE REDUCTION: This exam was performed according to the departmental dose-optimization program which includes automated exposure control, adjustment of the mA and/or kV according to patient size and/or use of iterative reconstruction technique. CONTRAST:  No additional. COMPARISON:  CT abdomen and pelvis 08/25/2021 FINDINGS: CT THORACIC SPINE FINDINGS Alignment: Mild-to-moderate upper thoracic levoscoliosis and mildly increased upper thoracic kyphosis. No listhesis. Vertebrae: No acute fracture or suspicious lesion. Paraspinal and other soft tissues: No acute abnormality in the paraspinal soft tissues. Main der of the chest reported separately. Disc levels: Mild thoracic spondylosis. No evidence of high-grade spinal canal stenosis. Facet arthrosis which is asymmetrically advanced on the right at T4-5 greater than T5-6 with mild right neural  foraminal stenosis at the former. CT LUMBAR SPINE FINDINGS Segmentation: 5 lumbar type vertebrae. Alignment: No significant listhesis. Vertebrae: Acute burst fracture of the L3 vertebral  body with 60% height loss centrally and 8 mm of retropulsion. No posterior element fracture. Unchanged mild chronic L1 superior endplate compression fracture. Paraspinal and other soft tissues: Mild paravertebral soft tissue swelling or hematoma at L3. Remainder of the abdomen and pelvis reported separately. Disc levels: L3 retropulsion results in severe spinal stenosis (6 mm AP spinal canal diameter). Mild spondylosis elsewhere. Disc bulging and facet and ligamentum flavum hypertrophy at L4-5 result in potentially moderate spinal stenosis. IMPRESSION: 1. Acute L3 burst fracture with 60% height loss and 8 mm of retropulsion resulting in severe spinal stenosis. 2. No acute fracture in the thoracic spine. Electronically Signed   By: Dasie Hamburg M.D.   On: 07/20/2024 17:50   CT HEAD WO CONTRAST Result Date: 07/20/2024 CLINICAL DATA:  Head trauma, moderate-severe; Polytrauma, blunt. Fall from 20-25 feet while cutting a tree. On blood thinners. EXAM: CT HEAD WITHOUT CONTRAST CT CERVICAL SPINE WITHOUT CONTRAST TECHNIQUE: Multidetector CT imaging of the head and cervical spine was performed following the standard protocol without intravenous contrast. Multiplanar CT image reconstructions of the cervical spine were also generated. RADIATION DOSE REDUCTION: This exam was performed according to the departmental dose-optimization program which includes automated exposure control, adjustment of the mA and/or kV according to patient size and/or use of iterative reconstruction technique. COMPARISON:  Head MRI 09/22/2021 FINDINGS: CT HEAD FINDINGS Brain: There is no evidence of an acute infarct, intracranial hemorrhage, midline shift, or extra-axial fluid collection. Cerebral volume is normal. The ventricles are normal in size. 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 Vascular: No hyperdense vessel. Skull: No acute fracture or suspicious lesion. Sinuses/Orbits: Small mucous retention cyst in the right maxillary sinus. Clear mastoid air cells. Unremarkable orbits. Other: Right occipital scalp hematoma. CT CERVICAL SPINE FINDINGS Alignment: Normal. Skull base and vertebrae: No acute fracture or suspicious lesion. Soft tissues and spinal canal: No prevertebral fluid or swelling. No visible canal hematoma. Disc levels: Mild cervical spondylosis. No evidence of high-grade spinal canal stenosis. Upper chest: More fully evaluated on the separately reported contemporaneous CT of the chest, abdomen, and pelvis. Other: None. IMPRESSION: 1. No evidence of acute intracranial abnormality or cervical spine fracture. 2. Right occipital scalp hematoma. 3. Known right-sided vestibular schwannoma. Electronically Signed   By: Dasie Hamburg M.D.   On: 07/20/2024 17:41   CT CERVICAL SPINE WO CONTRAST Result Date: 07/20/2024 CLINICAL DATA:  Head trauma, moderate-severe; Polytrauma, blunt. Fall from 20-25 feet while cutting a tree. On blood thinners. EXAM: CT HEAD WITHOUT CONTRAST CT CERVICAL SPINE WITHOUT CONTRAST TECHNIQUE: Multidetector CT imaging of the head and cervical spine was performed following the standard protocol without intravenous contrast. Multiplanar CT image reconstructions of the cervical spine were also generated. RADIATION DOSE REDUCTION: This exam was performed according to the departmental dose-optimization program which includes automated exposure control, adjustment of the mA and/or kV according to patient size and/or use of iterative reconstruction technique. COMPARISON:  Head MRI 09/22/2021 FINDINGS: CT HEAD FINDINGS Brain: There is no evidence of an acute infarct,  intracranial hemorrhage, midline shift, or extra-axial fluid collection. Cerebral volume is normal. The ventricles are normal in size. 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 Vascular: No hyperdense  vessel. Skull: No acute fracture or suspicious lesion. Sinuses/Orbits: Small mucous retention cyst in the right maxillary sinus. Clear mastoid air cells. Unremarkable orbits. Other: Right occipital scalp hematoma. CT CERVICAL SPINE FINDINGS Alignment: Normal. Skull base and vertebrae: No acute fracture or suspicious lesion. Soft tissues and spinal canal: No prevertebral fluid or swelling. No visible canal hematoma. Disc levels: Mild cervical spondylosis. No evidence of high-grade spinal canal stenosis. Upper chest: More fully evaluated on the separately reported contemporaneous CT of the chest, abdomen, and pelvis. Other: None. IMPRESSION: 1. No evidence of acute intracranial abnormality or cervical spine fracture. 2. Right occipital scalp hematoma. 3. Known right-sided vestibular schwannoma. Electronically Signed   By: Dasie Hamburg M.D.   On: 07/20/2024 17:41   DG Pelvis Portable Result Date: 07/20/2024 CLINICAL DATA:  Trauma EXAM: PORTABLE PELVIS 1-2 VIEWS COMPARISON:  None Available. FINDINGS: There is no evidence of pelvic fracture or diastasis. No pelvic bone lesions are seen. IMPRESSION: No fracture or dislocation. Electronically Signed   By: Jackquline Boxer M.D.   On: 07/20/2024 17:00   DG Chest Port 1 View Result Date: 07/20/2024 CLINICAL DATA:  Trauma EXAM: PORTABLE CHEST 1 VIEW COMPARISON:  None Available. FINDINGS: Normal mediastinum and cardiac silhouette. Normal pulmonary vasculature. No evidence of effusion, infiltrate, or pneumothorax. No acute bony abnormality. IMPRESSION: No acute cardiopulmonary process. Electronically  Signed   By: Jackquline Boxer M.D.   On: 07/20/2024 16:59    Assessment/Plan: Doing well.  Pain controlled.  Moving legs well.  Plan surgery on Wednesday  Estimated body mass index is 25.68 kg/m as calculated from the following:   Height as of this encounter: 5' 10 (1.778 m).   Weight as of this encounter: 81.2 kg.    LOS: 1 day    Daniel Foster Molt 07/21/2024, 9:36 AM

## 2024-07-21 NOTE — Plan of Care (Signed)
  Problem: Education: Goal: Knowledge of General Education information will improve Description: Including pain rating scale, medication(s)/side effects and non-pharmacologic comfort measures 07/21/2024 1906 by Vinie Dena RAMAN, RN Outcome: Progressing   Problem: Clinical Measurements: Goal: Ability to maintain clinical measurements within normal limits will improve 07/21/2024 1906 by Vinie Dena RAMAN, RN Outcome: Progressing 07/21/2024 1906 by Vinie Dena RAMAN, RN Outcome: Progressing   Problem: Clinical Measurements: Goal: Diagnostic test results will improve 07/21/2024 1906 by Vinie Dena RAMAN, RN Outcome: Progressing 07/21/2024 1906 by Vinie Dena RAMAN, RN Outcome: Progressing

## 2024-07-21 NOTE — Progress Notes (Signed)
..  Trauma Event Note    Reason for Call :  NSY repaged for clarification of primary service on admission  Last imported Vital Signs BP 107/80 (BP Location: Right Arm)   Pulse 74   Temp 98.6 F (37 C) (Oral)   Resp 20   Ht 5' 10 (1.778 m)   Wt 179 lb (81.2 kg)   SpO2 95%   BMI 25.68 kg/m   Trending CBC Recent Labs    07/20/24 1632 07/20/24 1649  WBC 7.8  --   HGB 15.1 14.6  HCT 45.2 43.0  PLT 215  --     Trending Coag's No results for input(s): APTT, INR in the last 72 hours.  Trending BMET Recent Labs    07/20/24 1632 07/20/24 1649  NA 142 143  K 4.1 4.1  CL 110 108  CO2 25  --   BUN 22 25*  CREATININE 1.39* 1.40*  GLUCOSE 94 94      Adair Lauderback Dee  Trauma Response RN  Please call TRN at [phone] for further assistance.

## 2024-07-22 MED ORDER — SIMETHICONE 80 MG PO CHEW
80.0000 mg | CHEWABLE_TABLET | Freq: Four times a day (QID) | ORAL | Status: DC | PRN
  Administered 2024-07-22 (×2): 80 mg via ORAL
  Filled 2024-07-22 (×2): qty 1

## 2024-07-22 NOTE — Plan of Care (Signed)

## 2024-07-22 NOTE — Plan of Care (Signed)

## 2024-07-22 NOTE — TOC Initial Note (Signed)
 Transition of Care [facility]) - Initial/Assessment Note    Patient Details  Name: Daniel Foster MRN: [id number] Date of Birth: July 22, 1961  [facility]) CM/SW Contact:    Andrez JULIANNA George, RN Phone Number: 07/22/2024, 2:54 PM  Clinical Narrative:                  Pt is from home with his spouse. They are together most of the time and she can provide some assist at home. Plan is for OR on Wednesday.  Pt manages his own medications. Spouse can provide needed transportation. IP Care management following.  Expected Discharge Plan:  (TBD) Barriers to Discharge: Continued Medical Work up   Patient Goals and CMS Choice            Expected Discharge Plan and Services       Living arrangements for the past 2 months: Single Family Home                                      Prior Living Arrangements/Services Living arrangements for the past 2 months: Single Family Home Lives with:: Spouse Patient language and need for interpreter reviewed:: Yes Do you feel safe going back to the place where you live?: Yes        Care giver support system in place?: Yes (comment) Current home services: DME (cane/ walker/ shower seat) Criminal Activity/Legal Involvement Pertinent to Current Situation/Hospitalization: No - Comment as needed  Activities of Daily Living   ADL Screening (condition at time of admission) Independently performs ADLs?: No Does the patient have a NEW difficulty with bathing/dressing/toileting/self-feeding that is expected to last >3 days?: Yes (Initiates electronic notice to provider for possible OT consult) Does the patient have a NEW difficulty with getting in/out of bed, walking, or climbing stairs that is expected to last >3 days?: Yes (Initiates electronic notice to provider for possible PT consult) Does the patient have a NEW difficulty with communication that is expected to last >3 days?: No Is the patient deaf or have difficulty hearing?:  Yes Does the patient have difficulty seeing, even when wearing glasses/contacts?: No Does the patient have difficulty concentrating, remembering, or making decisions?: No  Permission Sought/Granted                  Emotional Assessment Appearance:: Appears stated age Attitude/Demeanor/Rapport: Engaged Affect (typically observed): Accepting Orientation: : Oriented to Self, Oriented to Place, Oriented to  Time, Oriented to Situation   Psych Involvement: No (comment)  Admission diagnosis:  L3 vertebral fracture (HCC) [S32.039A] Abrasion of face, initial encounter [S00.81XA] Traumatic cephalohematoma, initial encounter [S00.93XA] Burst fracture of lumbar vertebra, closed, initial encounter [facility]) [S32.001A] Patient Active Problem List   Diagnosis Date Noted   L3 vertebral fracture (HCC) 07/20/2024   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 [place] [address] Phone: [phone] Fax: [phone]     Social Drivers of Health (SDOH) Social History: SDOH Screenings   Food Insecurity: No Food Insecurity (07/21/2024)  Housing: Low Risk  (07/21/2024)  Transportation Needs: No Transportation Needs (07/21/2024)  Utilities: Not At Risk (07/21/2024)  Depression (PHQ2-9): Low Risk  (08/17/2023)  Social Connections: Socially Integrated (07/21/2024)  Tobacco Use: Low Risk  (07/20/2024)   SDOH Interventions:     Readmission Risk Interventions     No data to display

## 2024-07-22 NOTE — Progress Notes (Signed)
 Patient ID: Daniel Foster, male   DOB: October 31, 1961, 63 y.o.   MRN: [id number] Doing well.  Back appropriately mildly sore.  No leg pain or numbness tingling or weakness.  Likely will move forward with ORIF of L3 fracture on Wednesday

## 2024-07-23 MED ORDER — BISACODYL 5 MG PO TBEC
5.0000 mg | DELAYED_RELEASE_TABLET | Freq: Every day | ORAL | Status: DC | PRN
  Filled 2024-07-23: qty 1

## 2024-07-23 NOTE — Progress Notes (Addendum)
 Subjective: Patient reports doing well, pain manageable   Objective: Vital signs in last 24 hours: Temp:  [98.1 F (36.7 C)-99 F (37.2 C)] 98.3 F (36.8 C) (08/19 0324) Pulse Rate:  [56-66] 60 (08/19 0324) Resp:  [16-19] 16 (08/19 0324) BP: (119-139)/(69-78) 128/76 (08/19 0324) SpO2:  [90 %-93 %] 91 % (08/19 0324)  Intake/Output from previous day: 08/18 0701 - 08/19 0700 In: 240 [P.O.:240] Out: 1450 [Urine:1450] Intake/Output this shift: No intake/output data recorded.  Neurologic: Grossly normal  Lab Results: Lab Results  Component Value Date   WBC 12.0 (H) 07/21/2024   HGB 14.6 07/21/2024   HCT 43.2 07/21/2024   MCV 87.8 07/21/2024   PLT 202 07/21/2024   Lab Results  Component Value Date   INR 1.05 09/16/2016   BMET Lab Results  Component Value Date   NA 143 07/20/2024   K 4.1 07/20/2024   CL 108 07/20/2024   CO2 25 07/20/2024   GLUCOSE 94 07/20/2024   BUN 25 (H) 07/20/2024   CREATININE 1.40 (H) 07/20/2024   CALCIUM  9.1 07/20/2024    Studies/Results: No results found.  Assessment/Plan: Very pleasant 63 year old male with an L3 burst fracture. We are planning to take him to the operating room tomorrow for stabilization. Please keep NPO after midnight tonight. Will work on his gas    LOS: 3 days    Suzen Chiquita Pean 07/23/2024, 7:52 AM

## 2024-07-23 NOTE — Plan of Care (Signed)
  Problem: Education: Goal: Knowledge of General Education information will improve Description: Including pain rating scale, medication(s)/side effects and non-pharmacologic comfort measures 07/23/2024 2144 by Enola Bald, RN Outcome: Progressing 07/23/2024 2135 by Enola Bald, RN Outcome: Progressing   Problem: Clinical Measurements: Goal: Ability to maintain clinical measurements within normal limits will improve 07/23/2024 2144 by Enola Bald, RN Outcome: Progressing 07/23/2024 2135 by Enola Bald, RN Outcome: Progressing Goal: Will remain free from infection 07/23/2024 2144 by Enola Bald, RN Outcome: Progressing 07/23/2024 2135 by Enola Bald, RN Outcome: Progressing Goal: Diagnostic test results will improve 07/23/2024 2144 by Enola Bald, RN Outcome: Progressing 07/23/2024 2135 by Enola Bald, RN Outcome: Progressing Goal: Respiratory complications will improve 07/23/2024 2144 by Enola Bald, RN Outcome: Progressing 07/23/2024 2135 by Enola Bald, RN Outcome: Progressing Goal: Cardiovascular complication will be avoided 07/23/2024 2144 by Enola Bald, RN Outcome: Progressing 07/23/2024 2135 by Enola Bald, RN Outcome: Progressing   Problem: Activity: Goal: Risk for activity intolerance will decrease Outcome: Progressing   Problem: Nutrition: Goal: Adequate nutrition will be maintained Outcome: Progressing   Problem: Coping: Goal: Level of anxiety will decrease Outcome: Progressing   Problem: Elimination: Goal: Will not experience complications related to bowel motility Outcome: Progressing Goal: Will not experience complications related to urinary retention Outcome: Progressing   Problem: Pain Managment: Goal: General experience of comfort will improve and/or be controlled Outcome: Progressing   Problem: Safety: Goal: Ability to  remain free from injury will improve Outcome: Progressing   Problem: Skin Integrity: Goal: Risk for impaired skin integrity will decrease Outcome: Progressing

## 2024-07-23 NOTE — Plan of Care (Signed)
  Problem: Education: Goal: Knowledge of General Education information will improve Description: Including pain rating scale, medication(s)/side effects and non-pharmacologic comfort measures Outcome: Progressing   Problem: Clinical Measurements: Goal: Ability to maintain clinical measurements within normal limits will improve Outcome: Progressing   Problem: Clinical Measurements: Goal: Diagnostic test results will improve Outcome: Progressing   

## 2024-07-23 NOTE — Care Management Important Message (Signed)
 Important Message  Patient Details  Name: Daniel Foster MRN: [id number] Date of Birth: [birth year]   Important Message Given:  Yes - Medicare IM     Claretta Deed 07/23/2024, 4:55 PM

## 2024-07-24 ENCOUNTER — Inpatient Hospital Stay (HOSPITAL_COMMUNITY)

## 2024-07-24 ENCOUNTER — Encounter (HOSPITAL_COMMUNITY)
Admission: EM | Disposition: A | Discharge status: Home Health | Payer: Self-pay | Source: Home / Self Care | Attending: Neurological Surgery

## 2024-07-24 ENCOUNTER — Encounter (HOSPITAL_COMMUNITY): Payer: Self-pay | Admitting: Neurological Surgery

## 2024-07-24 ENCOUNTER — Other Ambulatory Visit: Payer: Self-pay

## 2024-07-24 DIAGNOSIS — S32039A Unspecified fracture of third lumbar vertebra, initial encounter for closed fracture: Secondary | ICD-10-CM | POA: Diagnosis not present

## 2024-07-24 DIAGNOSIS — Z981 Arthrodesis status: Secondary | ICD-10-CM

## 2024-07-24 HISTORY — PX: LUMBAR LAMINECTOMY/DECOMPRESSION MICRODISCECTOMY: SHX5026

## 2024-07-24 LAB — SURGICAL PCR SCREEN
MRSA, PCR: NEGATIVE
Staphylococcus aureus: NEGATIVE

## 2024-07-24 SURGERY — LUMBAR LAMINECTOMY/DECOMPRESSION MICRODISCECTOMY 1 LEVEL
Anesthesia: General | Site: Back

## 2024-07-24 MED ORDER — OXYCODONE HCL 5 MG/5ML PO SOLN: 5.0000 mg | Freq: Once | ORAL | Status: DC | PRN

## 2024-07-24 MED ORDER — OXYCODONE HCL 5 MG PO TABS: 5.0000 mg | ORAL_TABLET | Freq: Once | ORAL | Status: DC | PRN

## 2024-07-24 MED ORDER — SODIUM CHLORIDE 0.9% FLUSH
3.0000 mL | Freq: Two times a day (BID) | INTRAVENOUS | Status: DC
  Administered 2024-07-24 – 2024-07-25 (×2): 3 mL via INTRAVENOUS

## 2024-07-24 MED ORDER — ONDANSETRON HCL 4 MG/2ML IJ SOLN: 4.0000 mg | Freq: Once | INTRAMUSCULAR | Status: DC | PRN

## 2024-07-24 MED ORDER — THROMBIN 5000 UNITS EX SOLR
CUTANEOUS | Status: DC | PRN
  Administered 2024-07-24: 5 mL via TOPICAL

## 2024-07-24 MED ORDER — ONDANSETRON HCL 4 MG/2ML IJ SOLN
INTRAMUSCULAR | Status: AC
  Filled 2024-07-24: qty 2

## 2024-07-24 MED ORDER — METHOCARBAMOL 1000 MG/10ML IJ SOLN: 500.0000 mg | Freq: Four times a day (QID) | INTRAMUSCULAR | Status: DC | PRN

## 2024-07-24 MED ORDER — PHENYLEPHRINE 80 MCG/ML (10ML) SYRINGE FOR IV PUSH (FOR BLOOD PRESSURE SUPPORT)
PREFILLED_SYRINGE | INTRAVENOUS | Status: AC
  Filled 2024-07-24: qty 10

## 2024-07-24 MED ORDER — CHLORHEXIDINE GLUCONATE 0.12 % MT SOLN
15.0000 mL | OROMUCOSAL | Status: AC
  Filled 2024-07-24: qty 15

## 2024-07-24 MED ORDER — EPHEDRINE 5 MG/ML INJ
INTRAVENOUS | Status: AC
  Filled 2024-07-24: qty 5

## 2024-07-24 MED ORDER — PROPOFOL 10 MG/ML IV BOLUS
INTRAVENOUS | Status: DC | PRN
Start: 2024-07-24 — End: 2024-07-25
  Administered 2024-07-24: 120 mg via INTRAVENOUS

## 2024-07-24 MED ORDER — THROMBIN 5000 UNITS EX KIT
PACK | CUTANEOUS | Status: AC
  Filled 2024-07-24: qty 1

## 2024-07-24 MED ORDER — FENTANYL CITRATE (PF) 100 MCG/2ML IJ SOLN
25.0000 ug | INTRAMUSCULAR | Status: DC | PRN
  Administered 2024-07-24: 25 ug via INTRAVENOUS

## 2024-07-24 MED ORDER — LIDOCAINE 2% (20 MG/ML) 5 ML SYRINGE
INTRAMUSCULAR | Status: AC
  Filled 2024-07-24: qty 5

## 2024-07-24 MED ORDER — SENNA 8.6 MG PO TABS
1.0000 | ORAL_TABLET | Freq: Two times a day (BID) | ORAL | Status: DC
  Administered 2024-07-24 – 2024-07-25 (×2): 8.6 mg via ORAL
  Filled 2024-07-24 (×2): qty 1

## 2024-07-24 MED ORDER — POTASSIUM CHLORIDE IN NACL 20-0.9 MEQ/L-% IV SOLN
INTRAVENOUS | Status: DC
  Filled 2024-07-24: qty 1000

## 2024-07-24 MED ORDER — DEXAMETHASONE SODIUM PHOSPHATE 10 MG/ML IJ SOLN
INTRAMUSCULAR | Status: AC
  Filled 2024-07-24: qty 2

## 2024-07-24 MED ORDER — THROMBIN 20000 UNITS EX SOLR
CUTANEOUS | Status: AC
  Filled 2024-07-24: qty 20000

## 2024-07-24 MED ORDER — MENTHOL 3 MG MT LOZG: 1.0000 | LOZENGE | OROMUCOSAL | Status: DC | PRN

## 2024-07-24 MED ORDER — FENTANYL CITRATE (PF) 250 MCG/5ML IJ SOLN
INTRAMUSCULAR | Status: AC
Start: 2024-07-24 — End: 2024-07-24
  Filled 2024-07-24: qty 5

## 2024-07-24 MED ORDER — ONDANSETRON HCL 4 MG/2ML IJ SOLN: 4.0000 mg | Freq: Four times a day (QID) | INTRAMUSCULAR | Status: DC | PRN

## 2024-07-24 MED ORDER — LIDOCAINE 2% (20 MG/ML) 5 ML SYRINGE
INTRAMUSCULAR | Status: DC | PRN
  Administered 2024-07-24: 100 mg via INTRAVENOUS

## 2024-07-24 MED ORDER — THROMBIN 20000 UNITS EX SOLR
CUTANEOUS | Status: DC | PRN
  Administered 2024-07-24: 20 mL via TOPICAL

## 2024-07-24 MED ORDER — BUPIVACAINE HCL (PF) 0.25 % IJ SOLN
INTRAMUSCULAR | Status: DC | PRN
  Administered 2024-07-24: 20 mL
  Administered 2024-07-24: 8 mL

## 2024-07-24 MED ORDER — ONDANSETRON HCL 4 MG/2ML IJ SOLN
INTRAMUSCULAR | Status: DC | PRN
  Administered 2024-07-24: 4 mg via INTRAVENOUS

## 2024-07-24 MED ORDER — CELECOXIB 200 MG PO CAPS
200.0000 mg | ORAL_CAPSULE | Freq: Two times a day (BID) | ORAL | Status: DC
  Administered 2024-07-24 – 2024-07-25 (×2): 200 mg via ORAL
  Filled 2024-07-24 (×2): qty 1

## 2024-07-24 MED ORDER — CEFAZOLIN SODIUM-DEXTROSE 2-4 GM/100ML-% IV SOLN
2.0000 g | Freq: Three times a day (TID) | INTRAVENOUS | Status: AC
  Administered 2024-07-24 – 2024-07-25 (×2): 2 g via INTRAVENOUS
  Filled 2024-07-24 (×2): qty 100

## 2024-07-24 MED ORDER — LACTATED RINGERS IV SOLN: INTRAVENOUS | Status: DC | PRN

## 2024-07-24 MED ORDER — ONDANSETRON HCL 4 MG PO TABS: 4.0000 mg | ORAL_TABLET | Freq: Four times a day (QID) | ORAL | Status: DC | PRN

## 2024-07-24 MED ORDER — ACETAMINOPHEN 10 MG/ML IV SOLN: 1000.0000 mg | Freq: Once | INTRAVENOUS | Status: DC | PRN

## 2024-07-24 MED ORDER — SURGIRINSE WOUND IRRIGATION SYSTEM - OPTIME
TOPICAL | Status: DC | PRN
  Administered 2024-07-24: 450 mL via TOPICAL

## 2024-07-24 MED ORDER — BUPIVACAINE HCL (PF) 0.25 % IJ SOLN
INTRAMUSCULAR | Status: AC
  Filled 2024-07-24: qty 30

## 2024-07-24 MED ORDER — CEFAZOLIN SODIUM-DEXTROSE 2-4 GM/100ML-% IV SOLN
INTRAVENOUS | Status: AC
  Filled 2024-07-24: qty 100

## 2024-07-24 MED ORDER — PHENYLEPHRINE HCL-NACL 20-0.9 MG/250ML-% IV SOLN
INTRAVENOUS | Status: DC | PRN
Start: 2024-07-24 — End: 2024-07-25
  Administered 2024-07-24: 20 ug/min via INTRAVENOUS

## 2024-07-24 MED ORDER — FENTANYL CITRATE (PF) 250 MCG/5ML IJ SOLN
INTRAMUSCULAR | Status: DC | PRN
  Administered 2024-07-24: 100 ug via INTRAVENOUS
  Administered 2024-07-24 (×2): 50 ug via INTRAVENOUS

## 2024-07-24 MED ORDER — MIDAZOLAM HCL 2 MG/2ML IJ SOLN
INTRAMUSCULAR | Status: DC | PRN
  Administered 2024-07-24: 2 mg via INTRAVENOUS

## 2024-07-24 MED ORDER — CHLORHEXIDINE GLUCONATE 0.12 % MT SOLN
OROMUCOSAL | Status: AC
  Administered 2024-07-24: 15 mL via OROMUCOSAL
  Filled 2024-07-24: qty 15

## 2024-07-24 MED ORDER — CEFAZOLIN SODIUM-DEXTROSE 2-4 GM/100ML-% IV SOLN
2.0000 g | Freq: Once | INTRAVENOUS | Status: AC
  Administered 2024-07-24: 2 g via INTRAVENOUS

## 2024-07-24 MED ORDER — PROPOFOL 10 MG/ML IV BOLUS
INTRAVENOUS | Status: AC
  Filled 2024-07-24: qty 20

## 2024-07-24 MED ORDER — SUGAMMADEX SODIUM 200 MG/2ML IV SOLN
INTRAVENOUS | Status: DC | PRN
  Administered 2024-07-24: 200 mg via INTRAVENOUS

## 2024-07-24 MED ORDER — LACTATED RINGERS IV SOLN: INTRAVENOUS | Status: DC

## 2024-07-24 MED ORDER — METHOCARBAMOL 500 MG PO TABS
500.0000 mg | ORAL_TABLET | Freq: Four times a day (QID) | ORAL | Status: DC | PRN
  Administered 2024-07-25: 500 mg via ORAL
  Filled 2024-07-24: qty 1

## 2024-07-24 MED ORDER — DEXAMETHASONE SODIUM PHOSPHATE 10 MG/ML IJ SOLN
INTRAMUSCULAR | Status: DC | PRN
  Administered 2024-07-24: 10 mg via INTRAVENOUS

## 2024-07-24 MED ORDER — ROCURONIUM BROMIDE 10 MG/ML (PF) SYRINGE
PREFILLED_SYRINGE | INTRAVENOUS | Status: AC
  Filled 2024-07-24: qty 10

## 2024-07-24 MED ORDER — PHENYLEPHRINE 80 MCG/ML (10ML) SYRINGE FOR IV PUSH (FOR BLOOD PRESSURE SUPPORT)
PREFILLED_SYRINGE | INTRAVENOUS | Status: DC | PRN
  Administered 2024-07-24: 80 ug via INTRAVENOUS

## 2024-07-24 MED ORDER — SODIUM CHLORIDE 0.9% FLUSH: 3.0000 mL | INTRAVENOUS | Status: DC | PRN

## 2024-07-24 MED ORDER — FENTANYL CITRATE (PF) 100 MCG/2ML IJ SOLN
INTRAMUSCULAR | Status: AC
  Filled 2024-07-24: qty 2

## 2024-07-24 MED ORDER — MIDAZOLAM HCL 2 MG/2ML IJ SOLN
INTRAMUSCULAR | Status: AC
Start: 2024-07-24 — End: 2024-07-24
  Filled 2024-07-24: qty 2

## 2024-07-24 MED ORDER — PHENOL 1.4 % MT LIQD
1.0000 | OROMUCOSAL | Status: DC | PRN
Start: 2024-07-24 — End: 2024-07-25

## 2024-07-24 MED ORDER — HYDROMORPHONE HCL 1 MG/ML IJ SOLN
1.0000 mg | INTRAMUSCULAR | Status: DC | PRN
  Administered 2024-07-25: 1 mg via INTRAVENOUS
  Filled 2024-07-24: qty 1

## 2024-07-24 MED ORDER — ROCURONIUM BROMIDE 10 MG/ML (PF) SYRINGE
PREFILLED_SYRINGE | INTRAVENOUS | Status: DC | PRN
  Administered 2024-07-24: 60 mg via INTRAVENOUS

## 2024-07-24 MED ORDER — ACETAMINOPHEN 500 MG PO TABS
1000.0000 mg | ORAL_TABLET | Freq: Four times a day (QID) | ORAL | Status: DC
  Administered 2024-07-24 – 2024-07-25 (×3): 1000 mg via ORAL
  Filled 2024-07-24 (×3): qty 2

## 2024-07-24 MED ORDER — SODIUM CHLORIDE 0.9 % IV SOLN: 250.0000 mL | INTRAVENOUS | Status: DC

## 2024-07-24 MED ORDER — 0.9 % SODIUM CHLORIDE (POUR BTL) OPTIME
TOPICAL | Status: DC | PRN
  Administered 2024-07-24: 1000 mL

## 2024-07-24 SURGICAL SUPPLY — 47 items
ALLOGRAFT BONE FIBER KORE 5 (Bone Implant) IMPLANT
BAG COUNTER SPONGE SURGICOUNT (BAG) ×2 IMPLANT
BAND RUBBER #18 3X1/16 STRL (MISCELLANEOUS) ×4 IMPLANT
BENZOIN TINCTURE PRP APPL 2/3 (GAUZE/BANDAGES/DRESSINGS) ×2 IMPLANT
BLADE BONE MILL MEDIUM (MISCELLANEOUS) IMPLANT
BUR CARBIDE MATCH 3.0 (BURR) ×2 IMPLANT
CANISTER SUCTION 3000ML PPV (SUCTIONS) ×2 IMPLANT
DERMABOND ADVANCED .7 DNX12 (GAUZE/BANDAGES/DRESSINGS) IMPLANT
DRAPE C-ARM 42X72 X-RAY (DRAPES) IMPLANT
DRAPE C-ARMOR (DRAPES) IMPLANT
DRAPE LAPAROTOMY 100X72X124 (DRAPES) ×2 IMPLANT
DRAPE MICROSCOPE SLANT 54X150 (MISCELLANEOUS) ×2 IMPLANT
DRAPE SURG 17X23 STRL (DRAPES) ×2 IMPLANT
DRSG AQUACEL AG ADV 3.5X10 (GAUZE/BANDAGES/DRESSINGS) IMPLANT
DURAPREP 26ML APPLICATOR (WOUND CARE) ×2 IMPLANT
ELECTRODE REM PT RTRN 9FT ADLT (ELECTROSURGICAL) ×2 IMPLANT
GAUZE 4X4 16PLY ~~LOC~~+RFID DBL (SPONGE) IMPLANT
GLOVE BIO SURGEON STRL SZ7 (GLOVE) IMPLANT
GLOVE BIO SURGEON STRL SZ8 (GLOVE) ×2 IMPLANT
GLOVE BIOGEL PI IND STRL 7.0 (GLOVE) IMPLANT
GOWN STRL REUS W/ TWL LRG LVL3 (GOWN DISPOSABLE) IMPLANT
GOWN STRL REUS W/ TWL XL LVL3 (GOWN DISPOSABLE) ×2 IMPLANT
GOWN STRL REUS W/TWL 2XL LVL3 (GOWN DISPOSABLE) IMPLANT
GRAFT BN 10X1XDBM MAGNIFUSE (Bone Implant) IMPLANT
HEMOSTAT POWDER KIT SURGIFOAM (HEMOSTASIS) ×2 IMPLANT
KIT BASIN OR (CUSTOM PROCEDURE TRAY) ×2 IMPLANT
KIT TURNOVER KIT B (KITS) ×2 IMPLANT
NDL HYPO 25X1 1.5 SAFETY (NEEDLE) ×2 IMPLANT
NDL SPNL 20GX3.5 QUINCKE YW (NEEDLE) IMPLANT
NEEDLE HYPO 25X1 1.5 SAFETY (NEEDLE) ×1 IMPLANT
NEEDLE SPNL 20GX3.5 QUINCKE YW (NEEDLE) IMPLANT
NS IRRIG 1000ML POUR BTL (IV SOLUTION) ×2 IMPLANT
PACK LAMINECTOMY NEURO (CUSTOM PROCEDURE TRAY) ×2 IMPLANT
PAD ARMBOARD POSITIONER FOAM (MISCELLANEOUS) ×6 IMPLANT
ROD LORD LIPPED TI 5.5X80 (Rod) IMPLANT
SCREW CANC SHANK MOD 6.5X45 (Screw) IMPLANT
SCREW POLYAXIAL TULIP (Screw) IMPLANT
SET SCREW SPNE (Screw) IMPLANT
SOLUTION IRRIG SURGIPHOR (IV SOLUTION) IMPLANT
SPONGE SURGIFOAM ABS GEL 100 (HEMOSTASIS) IMPLANT
STRIP CLOSURE SKIN 1/2X4 (GAUZE/BANDAGES/DRESSINGS) ×2 IMPLANT
SUT VIC AB 0 CT1 18XCR BRD8 (SUTURE) ×2 IMPLANT
SUT VIC AB 2-0 CP2 18 (SUTURE) ×2 IMPLANT
SUT VIC AB 3-0 SH 8-18 (SUTURE) ×2 IMPLANT
TOWEL GREEN STERILE (TOWEL DISPOSABLE) ×2 IMPLANT
TOWEL GREEN STERILE FF (TOWEL DISPOSABLE) ×2 IMPLANT
WATER STERILE IRR 1000ML POUR (IV SOLUTION) ×2 IMPLANT

## 2024-07-24 NOTE — H&P (Signed)
  Patient ready to move forward with open reduction internal fixation of L3 fracture.  All questions have been encouraged and answered.  Neurologic exam remains nonfocal.  He understands the risk of the surgery include but are not limited to bleeding, infection, CSF leak, nerve root injury, numbness, weakness, loss of bowel bladder sexual function, pseudoarthrosis, misplaced instrumentation, failed instrumentation, need for further surgery, active relief of symptoms, worsening symptoms, and anesthesia risk.  He understands this as well as alternatives and wishes to proceed

## 2024-07-24 NOTE — Op Note (Signed)
 07/24/2024  6:05 PM  PATIENT:  Daniel Foster  63 y.o. male  PRE-OPERATIVE DIAGNOSIS: Unstable L3 burst fracture with lumbar spinal stenosis  POST-OPERATIVE DIAGNOSIS:  same  PROCEDURE:   1.  Open reduction internal fixation of L3 fracture with decompressive lumbar laminectomy, medial facetectomy and foraminotomies L2-L3 4  2.. Posterior fixation L2-4 inclusive using ATEC cortical pedicle screws.   3. Intertransverse arthrodesis L2-L4 bilaterally using morcellized autograft and allograft.  SURGEON:  Alm Molt, MD  ASSISTANTS: Suzen Pean, FNP  ANESTHESIA:  General  EBL: Less than 100 ml  Total I/O In: 100 [IV Piggyback:100] Out: -   BLOOD ADMINISTERED: None  DRAINS: none   INDICATION FOR PROCEDURE: This patient presented with an L3 burst fracture after a fall. Imaging revealed L3 burst fracture with spinal stenosis at the pedicle level of L3. I recommended decompression and instrumented fusion to address the stenosis as well as the segmental  instability.  Patient understood the risks, benefits, and alternatives and potential outcomes and wished to proceed.  PROCEDURE DETAILS:  The patient was brought to the operating room. After induction of generalized endotracheal anesthesia the patient was rolled into the prone position on chest rolls and all pressure points were padded. The patient's lumbar region was cleaned and then prepped with DuraPrep and draped in the usual sterile fashion. Anesthesia was injected and then a dorsal midline incision was made and carried down to the lumbosacral fascia. The fascia was opened and the paraspinous musculature was taken down in a subperiosteal fashion to expose L2-3 and L3-4. A self-retaining retractor was placed. Intraoperative fluoroscopy confirmed my level, and I started with placement of the L2, L3, and L4 cortical pedicle screws. The pedicle screw entry zones were identified utilizing surface landmarks and  AP and lateral  fluoroscopy. I scored the cortex with the high-speed drill and then used the hand drill to drill an upward and outward direction into the pedicle. I then tapped line to line. I then placed a 6.5 x 45 mm cortical pedicle screw into the pedicles of L2, L3, and L4 bilaterally.    I then turned my attention to the decompression and complete lumbar laminectomies, medial facetectomies, and foraminotomies were performed at L2-3 and L3-4.  My nurse practitioner was directly involved in the decompression.  The yellow ligament was removed to expose the underlying dura and nerve roots, and generous foraminotomies were performed to adequately decompress the neural elements. Both the exiting and traversing nerve roots were decompressed on both sides until a coronary dilator passed easily along the nerve roots.  I palpated up underneath the dura and felt no further significant compression at the pedicle level.     We then decorticated the transverse processes and laid a mixture of morcellized autograft and allograft out over these to perform intertransverse arthrodesis at L2-3 and L3-4 bilaterally. We then placed lordotic rods into the multiaxial screw heads of the pedicle screws and locked these in position with the locking caps and anti-torque device. We then checked our construct with AP and lateral fluoroscopy. Irrigated with copious amounts of 0.5% povidone iodine solution followed by saline solution. Inspected the nerve roots once again to assure adequate decompression, lined to the dura with Gelfoam,  and then we closed the muscle and the fascia with 0 Vicryl. Closed the subcutaneous tissues with 2-0 Vicryl and subcuticular tissues with 3-0 Vicryl. The skin was closed with benzoin and Steri-Strips. Dressing was then applied, the patient was awakened from general anesthesia and transported to  the recovery room in stable condition. At the end of the procedure all sponge, needle and instrument counts were correct.    PLAN OF CARE: admit to inpatient  PATIENT DISPOSITION:  PACU - hemodynamically stable.   Delay start of Pharmacological VTE agent (>24hrs) due to surgical blood loss or risk of bleeding:  yes

## 2024-07-24 NOTE — Progress Notes (Signed)
 Received pt from PACU, Alert and oriented, with post op surgical dressing, dry, clean and intact, re oriented pt to ask RN with pain meds, placed call bell within reach.

## 2024-07-24 NOTE — Transfer of Care (Signed)
 Immediate Anesthesia Transfer of Care Note  Patient: Daniel Foster  Procedure(s) Performed: LUMBAR TWO-THREE, LUMBAR THREE-FOUR LAMINECTOMY/DECOMPRESSION, LUMBAR TWO-LUMBAR FOUR INSTRUMENTED FUSION (Back)  Patient Location: PACU  Anesthesia Type:General  Level of Consciousness: awake, alert , and oriented  Airway & Oxygen Therapy: Patient Spontanous Breathing and Patient connected to nasal cannula oxygen  Post-op Assessment: Report given to RN and Post -op Vital signs reviewed and stable  Post vital signs: Reviewed and stable  Last Vitals:  Vitals Value Taken Time  BP 135/75 07/24/24 18:15  Temp 36.4 C 07/24/24 18:12  Pulse 90 07/24/24 18:16  Resp 15 07/24/24 18:16  SpO2 92 % 07/24/24 18:16  Vitals shown include unfiled device data.  Last Pain:  Vitals:   07/24/24 1313  TempSrc:   PainSc: Asleep         Complications: No notable events documented.

## 2024-07-24 NOTE — Anesthesia Preprocedure Evaluation (Signed)
 Anesthesia Evaluation  Patient identified by MRN, date of birth, ID band Patient awake    Reviewed: Allergy & Precautions, NPO status , Patient's Chart, lab work & pertinent test results, reviewed documented beta blocker date and time   History of Anesthesia Complications Negative for: history of anesthetic complications  Airway Mallampati: II  TM Distance: >3 FB     Dental no notable dental hx.    Pulmonary neg COPD, neg PE   breath sounds clear to auscultation       Cardiovascular hypertension,  Rhythm:Regular Rate:Normal     Neuro/Psych  Headaches, neg Seizures  Neuromuscular disease    GI/Hepatic ,neg GERD  ,,(+) neg Cirrhosis        Endo/Other    Renal/GU Renal InsufficiencyRenal disease     Musculoskeletal   Abdominal   Peds  Hematology PRN xarelto  for prior DVT   Anesthesia Other Findings   Reproductive/Obstetrics                              Anesthesia Physical Anesthesia Plan  ASA: 2  Anesthesia Plan: General   Post-op Pain Management:    Induction: Intravenous  PONV Risk Score and Plan: 2 and Ondansetron  and Dexamethasone   Airway Management Planned: Oral ETT  Additional Equipment:   Intra-op Plan:   Post-operative Plan: Extubation in OR  Informed Consent: I have reviewed the patients History and Physical, chart, labs and discussed the procedure including the risks, benefits and alternatives for the proposed anesthesia with the patient or authorized representative who has indicated his/her understanding and acceptance.     Dental advisory given  Plan Discussed with: CRNA  Anesthesia Plan Comments:         Anesthesia Quick Evaluation

## 2024-07-24 NOTE — Anesthesia Procedure Notes (Signed)
 Procedure Name: Intubation Date/Time: 07/24/2024 4:15 PM  Performed by: Scherrie Mast, CRNAPre-anesthesia Checklist: Patient identified, Emergency Drugs available, Suction available and Patient being monitored Patient Re-evaluated:Patient Re-evaluated prior to induction Oxygen Delivery Method: Circle System Utilized Preoxygenation: Pre-oxygenation with 100% oxygen Induction Type: IV induction Ventilation: Mask ventilation without difficulty Laryngoscope Size: Mac and 3 Grade View: Grade I Tube type: Oral Tube size: 7.5 mm Number of attempts: 1 Airway Equipment and Method: Stylet and Oral airway Placement Confirmation: ETT inserted through vocal cords under direct vision, positive ETCO2 and breath sounds checked- equal and bilateral Secured at: 22 cm Tube secured with: Tape Dental Injury: Teeth and Oropharynx as per pre-operative assessment

## 2024-07-25 ENCOUNTER — Other Ambulatory Visit (HOSPITAL_COMMUNITY): Payer: Self-pay

## 2024-07-25 ENCOUNTER — Encounter (HOSPITAL_COMMUNITY): Payer: Self-pay | Admitting: Neurological Surgery

## 2024-07-25 MED ORDER — OXYCODONE HCL 10 MG PO TABS
10.0000 mg | ORAL_TABLET | ORAL | 0 refills | Status: DC | PRN
  Filled 2024-07-25 (×2): qty 30, 5d supply, fill #0

## 2024-07-25 MED ORDER — METHOCARBAMOL 500 MG PO TABS
500.0000 mg | ORAL_TABLET | Freq: Four times a day (QID) | ORAL | 1 refills | Status: DC | PRN
  Filled 2024-07-25 (×2): qty 60, 15d supply, fill #0

## 2024-07-25 NOTE — Progress Notes (Signed)
 Physical Therapy Treatment Patient Details Name: Daniel Foster MRN: [id number] DOB: Oct 27, 1961 Today's Date: 07/25/2024   History of Present Illness 63 y.o. male [facility]/20/24 after falling off a ladder. CT lumbar showed L3 burst fx. 8/20 L2-L4 laminectomy/decompression and fusion. PMHx: DVT, HTN, R sided acoustic neuroma   PT Comments  Follow-up PT session after LSO was delivered to increase gait distance and work on stair negotiation. Pt prefers ambulating with RW for comfort and was able to walk 300 ft with supervision. Practiced using RW and backwards approach to negotiate steps as well as sideways step to pattern with BUE on handrail. Discussed proper guarding and technique with wife and pt verbalizing understanding. Pt is feeling more comfortable discharging home with current support system. Continue to recommend OP PT with acute PT to follow.     If plan is discharge home, recommend the following: A little help with walking and/or transfers;A little help with bathing/dressing/bathroom;Assist for transportation;Help with stairs or ramp for entrance   Can travel by private vehicle      Yes  Equipment Recommendations  Rolling walker (2 wheels)       Precautions / Restrictions Precautions Precautions: Fall;Back Precaution Booklet Issued: Yes (comment) Recall of Precautions/Restrictions: Intact Required Braces or Orthoses: Spinal Brace Spinal Brace: Applied in sitting position;Lumbar corset Restrictions Weight Bearing Restrictions Per Provider Order: No     Mobility  Bed Mobility Overal bed mobility: Needs Assistance Bed Mobility: Rolling, Sidelying to Sit, Sit to Sidelying Rolling: Supervision, Used rails Sidelying to sit: Supervision    Sit to sidelying: Supervision General bed mobility comments: good recall for log roll technique    Transfers Overall transfer level: Needs assistance Equipment used: Rolling walker (2 wheels) Transfers: Sit to/from Stand Sit  to Stand: Contact guard assist    General transfer comment: for safety    Ambulation/Gait Ambulation/Gait assistance: Supervision Gait Distance (Feet): 300 Feet Assistive device: Rolling walker (2 wheels) Gait Pattern/deviations: Step-through pattern, Decreased stride length Gait velocity: decr    General Gait Details: steady gait with no overt LOB   Stairs Stairs: Yes Stairs assistance: Contact guard assist Stair Management: One rail Left, Step to pattern, Forwards, Backwards, With walker, Sideways Number of Stairs: 8 (x6, x2) General stair comments: x6 steps with RW using backwards technique to ascend. x2 steps with one handrail and sideways step to pattern. Cues for technique and safe guarding     Balance Overall balance assessment: Needs assistance Sitting-balance support: No upper extremity supported, Feet supported Sitting balance-Leahy Scale: Good     Standing balance support: Bilateral upper extremity supported, During functional activity, Reliant on assistive device for balance Standing balance-Leahy Scale: Poor Standing balance comment: reliant on external and UE support       Communication Communication Communication: No apparent difficulties  Cognition Arousal: Alert Behavior During Therapy: WFL for tasks assessed/performed   PT - Cognitive impairments: No apparent impairments    Following commands: Intact      Cueing Cueing Techniques: Verbal cues, Tactile cues     General Comments General comments (skin integrity, edema, etc.): Wife and son present and supportive      Pertinent Vitals/Pain Pain Assessment Pain Assessment: Faces Faces Pain Scale: Hurts a little bit Pain Location: incision Pain Descriptors / Indicators: Aching, Discomfort Pain Intervention(s): Limited activity within patient's tolerance, Monitored during session, Premedicated before session, Repositioned    Home Living Family/patient expects to be discharged to:: Private  residence Living Arrangements: Spouse/significant other Available Help at Discharge: Family;Available  24 hours/day Type of Home: House Home Access: Stairs to enter;Ramped entrance Entrance Stairs-Rails: None Entrance Stairs-Number of Steps: 2 (front)   Home Layout: Two level;Full bath on main level;Able to live on main level with bedroom/bathroom Home Equipment: None          PT Goals (current goals can now be found in the care plan section) Acute Rehab PT Goals Patient Stated Goal: to be able to go to the gym PT Goal Formulation: With patient/family Time For Goal Achievement: 08/08/24 Potential to Achieve Goals: Good Progress towards PT goals: Progressing toward goals    Frequency    Min 2X/week       AM-PAC PT 6 Clicks Mobility   Outcome Measure  Help needed turning from your back to your side while in a flat bed without using bedrails?: A Little Help needed moving from lying on your back to sitting on the side of a flat bed without using bedrails?: A Little Help needed moving to and from a bed to a chair (including a wheelchair)?: A Little Help needed standing up from a chair using your arms (e.g., wheelchair or bedside chair)?: A Little Help needed to walk in hospital room?: A Little Help needed climbing 3-5 steps with a railing? : A Little 6 Click Score: 18    End of Session Equipment Utilized During Treatment: Gait belt;Back brace Activity Tolerance: Patient tolerated treatment well Patient left: with call bell/phone within reach;with family/visitor present;in bed Nurse Communication: Mobility status PT Visit Diagnosis: Other abnormalities of gait and mobility (R26.89)     Time: 8844-8783 PT Time Calculation (min) (ACUTE ONLY): 21 min  Charges:    $Therapeutic Activity: 8-22 mins PT General Charges $$ ACUTE PT VISIT: 1 Visit                     Kate ORN, PT, DPT Secure Chat Preferred  Rehab Office [phone]   Kate BRAVO Wendolyn 07/25/2024, 12:23  PM

## 2024-07-25 NOTE — TOC Transition Note (Signed)
 Transition of Care [facility]) - Discharge Note   Patient Details  Name: Daniel Foster MRN: [id number] Date of Birth: 1961-09-18  Transition of [facility]) CM/SW Contact:  Andrez JULIANNA George, RN Phone Number: 07/25/2024, 10:29 AM   Clinical Narrative:     Pt is discharging home with home health services through [facility]. Information on the AVS. [facility] will contact him for the first home visit.  Walker/ 3 in 1 ordered through [employer] and will be delivered to the room.  Pt has supervision at home and transportation to home.    Barriers to Discharge: Continued Medical Work up   Patient Goals and CMS Choice   CMS Medicare.gov Compare Post Acute Care list provided to:: Patient Choice offered to / list presented to : Patient      Discharge Placement                       Discharge Plan and Services Additional resources added to the After Visit Summary for       Post Acute Care Choice: Home Health, Durable Medical Equipment          DME Arranged: 3-N-1, Walker rolling DME Agency: [employer] Date DME Agency Contacted: 07/25/24   Representative spoke with at DME Agency: [facility] HH Arranged: PT, OT HH Agency: [facility] Date [facility] Agency Contacted: 07/25/24   Representative spoke with at [facility] Agency: [facility]  Social Drivers of Health (SDOH) Interventions SDOH Screenings   Food Insecurity: No Food Insecurity (07/21/2024)  Housing: Low Risk  (07/21/2024)  Transportation Needs: No Transportation Needs (07/21/2024)  Utilities: Not At Risk (07/21/2024)  Depression (PHQ2-9): Low Risk  (08/17/2023)  Social Connections: Socially Integrated (07/21/2024)  Tobacco Use: Low Risk  (07/24/2024)     Readmission Risk Interventions     No data to display

## 2024-07-25 NOTE — Evaluation (Signed)
 Physical Therapy Evaluation Patient Details Name: Daniel Foster MRN: [id number] DOB: [birth year] Today's Date: 07/25/2024  History of Present Illness  63 y.o. male presents to [facility] 07/1624 after falling off a ladder. CT lumbar showed L3 burst fx. 8/20 L2-L4 laminectomy/decompression and fusion. PMHx: DVT, HTN, R sided acoustic neuroma  Clinical Impression  Pt in bed upon arrival with wife present and agreeable to PT eval. PTA, pt was independent for mobility with no AD. In today's session, educated pt on spinal precautions, car transfer, and progressive mobility. Limited gait distance as pt did not have back brace in room upon arrival. Pt was CGA for all mobility with ability to ambulate 43ft with RW. Pt has 24/7 level of assist available at home. Recommending post-acute OP PT to work towards independence with mobility. Pt would benefit from acute skilled PT with current functional limitations listed below (see PT Problem List). Acute PT to follow.         If plan is discharge home, recommend the following: A little help with walking and/or transfers;A little help with bathing/dressing/bathroom;Assist for transportation;Help with stairs or ramp for entrance   Can travel by private vehicle    Yes    Equipment Recommendations Rolling walker (2 wheels)     Functional Status Assessment Patient has had a recent decline in their functional status and demonstrates the ability to make significant improvements in function in a reasonable and predictable amount of time.     Precautions / Restrictions Precautions Precautions: Fall;Back Precaution Booklet Issued: Yes (comment) Recall of Precautions/Restrictions: Intact Required Braces or Orthoses: Spinal Brace Spinal Brace: Applied in sitting position (brace not in room upon arrival, notified ortho tech) Restrictions Weight Bearing Restrictions Per Provider Order: No      Mobility  Bed Mobility Overal bed mobility: Needs Assistance Bed  Mobility: Rolling, Sidelying to Sit, Sit to Sidelying Rolling: Supervision, Used rails Sidelying to sit: Contact guard assist    Sit to sidelying: Contact guard assist General bed mobility comments: no physical assist needed with cues for log roll technique    Transfers Overall transfer level: Needs assistance Equipment used: Rolling walker (2 wheels) Transfers: Sit to/from Stand Sit to Stand: Contact guard assist    General transfer comment: for safety, cues for hand placement    Ambulation/Gait Ambulation/Gait assistance: Supervision Gait Distance (Feet): 30 Feet Assistive device: Rolling walker (2 wheels) Gait Pattern/deviations: Step-through pattern, Decreased stride length Gait velocity: decr     General Gait Details: steady gait with no overt LOB, cues for RW proximity    Balance Overall balance assessment: Needs assistance Sitting-balance support: No upper extremity supported, Feet supported Sitting balance-Leahy Scale: Good     Standing balance support: Bilateral upper extremity supported, During functional activity, Reliant on assistive device for balance Standing balance-Leahy Scale: Poor Standing balance comment: reliant on external and UE support       Pertinent Vitals/Pain Pain Assessment Pain Assessment: Faces Faces Pain Scale: Hurts a little bit Pain Location: incision Pain Descriptors / Indicators: Aching, Discomfort Pain Intervention(s): Limited activity within patient's tolerance, Monitored during session, Repositioned    Home Living Family/patient expects to be discharged to:: Private residence Living Arrangements: Spouse/significant other Available Help at Discharge: Family;Available 24 hours/day Type of Home: House Home Access: Stairs to enter;Ramped entrance Entrance Stairs-Rails: None Entrance Stairs-Number of Steps: 2 (front)   Home Layout: Two level;Full bath on main level;Able to live on main level with bedroom/bathroom         Prior Function Prior Level  of Function : Independent/Modified Independent;Driving;History of Falls (last six months)    Mobility Comments: Ind w/ no AD ADLs Comments: Ind     Extremity/Trunk Assessment   Upper Extremity Assessment Upper Extremity Assessment: Defer to OT evaluation    Lower Extremity Assessment Lower Extremity Assessment: Overall WFL for tasks assessed    Cervical / Trunk Assessment Cervical / Trunk Assessment: Normal  Communication   Communication Communication: No apparent difficulties    Cognition Arousal: Alert Behavior During Therapy: WFL for tasks assessed/performed   PT - Cognitive impairments: No apparent impairments    Following commands: Intact       Cueing Cueing Techniques: Verbal cues, Tactile cues     General Comments General comments (skin integrity, edema, etc.): Wife present and supportive during session     PT Assessment Patient needs continued PT services  PT Problem List Decreased activity tolerance;Decreased balance;Decreased mobility;Decreased knowledge of use of DME;Decreased safety awareness;Decreased knowledge of precautions       PT Treatment Interventions DME instruction;Gait training;Stair training;Functional mobility training;Therapeutic activities;Therapeutic exercise;Balance training;Neuromuscular re-education;Patient/family education    PT Goals (Current goals can be found in the Care Plan section)  Acute Rehab PT Goals Patient Stated Goal: to be able to go to the gym PT Goal Formulation: With patient/family Time For Goal Achievement: 08/08/24 Potential to Achieve Goals: Good    Frequency Min 5X/week        AM-PAC PT 6 Clicks Mobility  Outcome Measure Help needed turning from your back to your side while in a flat bed without using bedrails?: A Little Help needed moving from lying on your back to sitting on the side of a flat bed without using bedrails?: A Little Help needed moving to and from a bed to a  chair (including a wheelchair)?: A Little Help needed standing up from a chair using your arms (e.g., wheelchair or bedside chair)?: A Little Help needed to walk in hospital room?: A Little Help needed climbing 3-5 steps with a railing? : A Little 6 Click Score: 18    End of Session Equipment Utilized During Treatment: Gait belt Activity Tolerance: Patient tolerated treatment well Patient left: in chair;with call bell/phone within reach;with family/visitor present Nurse Communication: Mobility status PT Visit Diagnosis: Other abnormalities of gait and mobility (R26.89)    Time: 9159-9089 PT Time Calculation (min) (ACUTE ONLY): 30 min   Charges:   PT Evaluation $PT Eval Low Complexity: 1 Low PT Treatments $Therapeutic Activity: 8-22 mins PT General Charges $$ ACUTE PT VISIT: 1 Visit        Kate ORN, PT, DPT Secure Chat Preferred  Rehab Office [phone]   Kate BRAVO Wendolyn 07/25/2024, 9:34 AM

## 2024-07-25 NOTE — Anesthesia Postprocedure Evaluation (Signed)
 Anesthesia Post Note  Patient: Daniel Foster  Procedure(s) Performed: LUMBAR TWO-THREE, LUMBAR THREE-FOUR LAMINECTOMY/DECOMPRESSION, LUMBAR TWO-LUMBAR FOUR INSTRUMENTED FUSION (Back)     Patient location during evaluation: PACU Anesthesia Type: General Level of consciousness: awake and alert Pain management: pain level controlled Vital Signs Assessment: post-procedure vital signs reviewed and stable Respiratory status: spontaneous breathing, nonlabored ventilation, respiratory function stable and patient connected to nasal cannula oxygen Cardiovascular status: blood pressure returned to baseline and stable Postop Assessment: no apparent nausea or vomiting Anesthetic complications: no   No notable events documented.  Last Vitals:  Vitals:   07/24/24 2336 07/25/24 0432  BP: 127/69 115/66  Pulse: 82 64  Resp: 16 18  Temp: 36.6 C 36.4 C  SpO2: 98% 91%    Last Pain:  Vitals:   07/25/24 0432  TempSrc: Oral  PainSc:                  [place] MARLA Cornea

## 2024-07-25 NOTE — Evaluation (Signed)
 Occupational Therapy Evaluation Patient Details Name: Daniel Foster MRN: [id number] DOB: Oct 23, 1961 Today's Date: 07/25/2024   History of Present Illness   63 y.o. male presents to [facility] 07/1624 after falling off a ladder. CT lumbar showed L3 burst fx. 8/20 L2-L4 laminectomy/decompression and fusion. PMHx: DVT, HTN, R sided acoustic neuroma     Clinical Impressions PTA patient independent. Admitted for above and presents with problem list below. Admitted for above and presents with problem list below.  Pt requires setup to mod assist for ADLs, contact guard for transfers and mobility using RW.  Educated on back precautions, ADL compensatory techniques, recommendations and safety.  Spouse available to assist at dc. Pt recalling and following precautions during session without cueing.  Will follow acutely, no OT needed after dc home.       If plan is discharge home, recommend the following:   A little help with walking and/or transfers;A little help with bathing/dressing/bathroom;Assistance with cooking/housework;Assist for transportation     Functional Status Assessment   Patient has had a recent decline in their functional status and demonstrates the ability to make significant improvements in function in a reasonable and predictable amount of time.     Equipment Recommendations   BSC/3in1     Recommendations for Other Services         Precautions/Restrictions   Precautions Precautions: Fall;Back Precaution Booklet Issued: Yes (comment) Recall of Precautions/Restrictions: Intact Required Braces or Orthoses: Spinal Brace Spinal Brace: Applied in sitting position (brace not in room upon arrival, notified ortho tech) Restrictions Weight Bearing Restrictions Per Provider Order: No     Mobility Bed Mobility Overal bed mobility: Needs Assistance Bed Mobility: Rolling, Sidelying to Sit, Sit to Sidelying Rolling: Supervision, Used rails Sidelying to sit: Contact guard  assist       General bed mobility comments: no physical assist needed with cues for log roll technique    Transfers Overall transfer level: Needs assistance Equipment used: Rolling walker (2 wheels) Transfers: Sit to/from Stand Sit to Stand: Contact guard assist           General transfer comment: for safety, cues for hand placement      Balance Overall balance assessment: Needs assistance Sitting-balance support: No upper extremity supported, Feet supported Sitting balance-Leahy Scale: Good     Standing balance support: Bilateral upper extremity supported, During functional activity, Reliant on assistive device for balance Standing balance-Leahy Scale: Poor Standing balance comment: reliant on external and UE support                           ADL either performed or assessed with clinical judgement   ADL Overall ADL's : Needs assistance/impaired     Grooming: Contact guard assist;Standing           Upper Body Dressing : Set up;Sitting   Lower Body Dressing: Minimal assistance;Sit to/from stand Lower Body Dressing Details (indicate cue type and reason): assist for socks, unable to complete figure 4 technique. will have spouse assist her Toilet Transfer: Contact guard assist;Ambulation;Rolling walker (2 wheels)   Toileting- Clothing Manipulation and Hygiene: Sit to/from stand;Minimal assistance       Functional mobility during ADLs: Contact guard assist;Rolling walker (2 wheels);Cueing for safety       Vision   Vision Assessment?: No apparent visual deficits     Perception         Praxis         Pertinent Vitals/Pain Pain Assessment Pain  Assessment: Faces Faces Pain Scale: Hurts a little bit Pain Location: incision Pain Descriptors / Indicators: Aching, Discomfort Pain Intervention(s): Monitored during session, Limited activity within patient's tolerance, Repositioned     Extremity/Trunk Assessment Upper Extremity  Assessment Upper Extremity Assessment: Overall WFL for tasks assessed   Lower Extremity Assessment Lower Extremity Assessment: Defer to PT evaluation   Cervical / Trunk Assessment Cervical / Trunk Assessment: Normal   Communication Communication Communication: No apparent difficulties   Cognition Arousal: Alert Behavior During Therapy: WFL for tasks assessed/performed Cognition: No apparent impairments                               Following commands: Intact       Cueing  General Comments   Cueing Techniques: Verbal cues;Tactile cues  wife present and supportive   Exercises     Shoulder Instructions      Home Living Family/patient expects to be discharged to:: Private residence Living Arrangements: Spouse/significant other Available Help at Discharge: Family;Available 24 hours/day Type of Home: House Home Access: Stairs to enter;Ramped entrance Entrance Stairs-Number of Steps: 2 (front) Entrance Stairs-Rails: None Home Layout: Two level;Full bath on main level;Able to live on main level with bedroom/bathroom     Bathroom Shower/Tub: Walk-in shower (lip (4 inches))   Bathroom Toilet: Standard     Home Equipment: None          Prior Functioning/Environment Prior Level of Function : Independent/Modified Independent;Driving;History of Falls (last six months)             Mobility Comments: Ind w/ no AD ADLs Comments: Ind    OT Problem List: Decreased strength;Decreased activity tolerance;Impaired balance (sitting and/or standing);Pain;Decreased knowledge of precautions;Decreased knowledge of use of DME or AE   OT Treatment/Interventions: Self-care/ADL training;Therapeutic exercise;DME and/or AE instruction;Therapeutic activities;Patient/family education      OT Goals(Current goals can be found in the care plan section)   Acute Rehab OT Goals Patient Stated Goal: home OT Goal Formulation: With patient Time For Goal Achievement:  08/08/24 Potential to Achieve Goals: Good   OT Frequency:  Min 2X/week    Co-evaluation              AM-PAC OT 6 Clicks Daily Activity     Outcome Measure Help from another person eating meals?: None Help from another person taking care of personal grooming?: A Little Help from another person toileting, which includes using toliet, bedpan, or urinal?: A Little Help from another person bathing (including washing, rinsing, drying)?: A Little Help from another person to put on and taking off regular upper body clothing?: A Little Help from another person to put on and taking off regular lower body clothing?: A Little 6 Click Score: 19   End of Session Equipment Utilized During Treatment: Rolling walker (2 wheels) Nurse Communication: Mobility status  Activity Tolerance: Patient tolerated treatment well Patient left: in bed;with call bell/phone within reach;with bed alarm set;with family/visitor present  OT Visit Diagnosis: Other abnormalities of gait and mobility (R26.89);Muscle weakness (generalized) (M62.81);Pain Pain - part of body:  (back)                Time: 8971-8945 OT Time Calculation (min): 26 min Charges:  OT General Charges $OT Visit: 1 Visit OT Evaluation $OT Eval Moderate Complexity: 1 Mod OT Treatments $Self Care/Home Management : 8-22 mins  Etta NOVAK, OT Acute Rehabilitation Services Office [phone] Secure Chat Preferred    Etta  S Ashby Moskal 07/25/2024, 11:36 AM

## 2024-07-25 NOTE — Progress Notes (Signed)
 Orthopedic Tech Progress Note Patient Details:  Daniel Foster 11-06-1961 [id number]  Ortho Devices Type of Ortho Device: Lumbar corsett Ortho Device/Splint Location: adjusted, at bedside Ortho Device/Splint Interventions: Ordered, Adjustment   Post Interventions Patient Tolerated: Well Instructions Provided: Care of device, Adjustment of device  Phillipa Morden Ronal Brasil 07/25/2024, 11:29 AM

## 2024-07-25 NOTE — Discharge Summary (Signed)
 Physician Discharge Summary  Patient ID: Daniel Foster MRN: [id number] DOB/AGE: Aug 01, 1961 63 y.o.  Admit date: 07/20/2024 Discharge date: 07/25/2024  Admission Diagnoses: L3 fracture    Discharge Diagnoses: same   Discharged Condition: good  Hospital Course: The patient was admitted on 07/20/2024 with an unstable L3 burst fracture.  He was taken to the operating room yesterday where the patient underwent open reduction internal fixation of an L3 fracture. The patient tolerated the procedure well and was taken to the recovery room and then to the floor in stable condition. The hospital course was routine. There were no complications. The wound remained clean dry and intact. Pt had appropriate back soreness. No complaints of leg pain or new N/T/W. The patient remained afebrile with stable vital signs, and tolerated a regular diet. The patient continued to increase activities, and pain was well controlled with oral pain medications.   Consults: None  Significant Diagnostic Studies:  Results for orders placed or performed during the hospital encounter of 07/20/24  Comprehensive metabolic panel   Collection Time: 07/20/24  4:32 PM  Result Value Ref Range   Sodium 142 135 - 145 mmol/L   Potassium 4.1 3.5 - 5.1 mmol/L   Chloride 110 98 - 111 mmol/L   CO2 25 22 - 32 mmol/L   Glucose, Bld 94 70 - 99 mg/dL   BUN 22 8 - 23 mg/dL   Creatinine, Ser 8.60 (H) 0.61 - 1.24 mg/dL   Calcium  9.1 8.9 - 10.3 mg/dL   Total Protein 6.0 (L) 6.5 - 8.1 g/dL   Albumin 3.5 3.5 - 5.0 g/dL   AST 797 (H) 15 - 41 U/L   ALT 81 (H) 0 - 44 U/L   Alkaline Phosphatase 76 38 - 126 U/L   Total Bilirubin 1.0 0.0 - 1.2 mg/dL   GFR, Estimated 56 (L) >60 mL/min   Anion gap 7 5 - 15  CBC   Collection Time: 07/20/24  4:32 PM  Result Value Ref Range   WBC 7.8 4.0 - 10.5 K/uL   RBC 5.16 4.22 - 5.81 MIL/uL   Hemoglobin 15.1 13.0 - 17.0 g/dL   HCT 54.7 60.9 - 47.9 %   MCV 87.6 80.0 - 100.0 fL   MCH 29.3 26.0 - 34.0 pg    MCHC 33.4 30.0 - 36.0 g/dL   RDW 88.0 88.4 - 84.4 %   Platelets 215 150 - 400 K/uL   nRBC 0.0 0.0 - 0.2 %  Ethanol   Collection Time: 07/20/24  4:32 PM  Result Value Ref Range   Alcohol, Ethyl (B) <15 <15 mg/dL  I-Stat Chem 8, ED   Collection Time: 07/20/24  4:49 PM  Result Value Ref Range   Sodium 143 135 - 145 mmol/L   Potassium 4.1 3.5 - 5.1 mmol/L   Chloride 108 98 - 111 mmol/L   BUN 25 (H) 8 - 23 mg/dL   Creatinine, Ser 8.59 (H) 0.61 - 1.24 mg/dL   Glucose, Bld 94 70 - 99 mg/dL   Calcium , Ion 1.22 1.15 - 1.40 mmol/L   TCO2 23 22 - 32 mmol/L   Hemoglobin 14.6 13.0 - 17.0 g/dL   HCT 56.9 60.9 - 47.9 %  I-Stat Lactic Acid, ED   Collection Time: 07/20/24  4:50 PM  Result Value Ref Range   Lactic Acid, Venous 1.4 0.5 - 1.9 mmol/L  CBC   Collection Time: 07/21/24  5:11 AM  Result Value Ref Range   WBC 12.0 (H) 4.0 - 10.5 K/uL  RBC 4.92 4.22 - 5.81 MIL/uL   Hemoglobin 14.6 13.0 - 17.0 g/dL   HCT 56.7 60.9 - 47.9 %   MCV 87.8 80.0 - 100.0 fL   MCH 29.7 26.0 - 34.0 pg   MCHC 33.8 30.0 - 36.0 g/dL   RDW 87.6 88.4 - 84.4 %   Platelets 202 150 - 400 K/uL   nRBC 0.0 0.0 - 0.2 %  Surgical pcr screen   Collection Time: 07/24/24  3:04 PM   Specimen: Nasal Mucosa; Nasal Swab  Result Value Ref Range   MRSA, PCR NEGATIVE NEGATIVE   Staphylococcus aureus NEGATIVE NEGATIVE    DG Lumbar Spine 2-3 Views Result Date: 07/24/2024 CLINICAL DATA:  Elective surgery EXAM: LUMBAR SPINE - 2-3 VIEW COMPARISON:  Lumbar spine CT 07/20/2024 FINDINGS: L2, L3, L4 posterior fusion hardware is present. Alignment is anatomic. Fluoroscopy time 43.6 seconds. Fluoroscopy dose 24.3 micro gray. IMPRESSION: L2, L3, L4 posterior fusion hardware. Electronically Signed   By: Greig Pique M.D.   On: 07/24/2024 18:20   DG C-Arm 1-60 Min-No Report Result Date: 07/24/2024 Fluoroscopy was utilized by the requesting physician.  No radiographic interpretation.   DG C-Arm 1-60 Min-No Report Result Date:  07/24/2024 Fluoroscopy was utilized by the requesting physician.  No radiographic interpretation.   CT CHEST ABDOMEN PELVIS W CONTRAST Result Date: 07/20/2024 CLINICAL DATA:  Blunt trauma EXAM: CT CHEST, ABDOMEN, AND PELVIS WITH CONTRAST TECHNIQUE: Multidetector CT imaging of the chest, abdomen and pelvis was performed following the standard protocol during bolus administration of intravenous contrast. RADIATION DOSE REDUCTION: This exam was performed according to the departmental dose-optimization program which includes automated exposure control, adjustment of the mA and/or kV according to patient size and/or use of iterative reconstruction technique. CONTRAST:  75mL OMNIPAQUE  IOHEXOL  350 MG/ML SOLN COMPARISON:  None Available. FINDINGS: CT CHEST FINDINGS Cardiovascular: Mild aortic atherosclerosis. No aneurysm. Normal cardiac size. No pericardial effusion. Mild coronary vascular calcification. Mediastinum/Nodes: Patent trachea. No thyroid  mass. No suspicious lymph nodes. Esophagus within normal limits. Lungs/Pleura: No acute airspace disease, pleural effusion, or pneumothorax. Musculoskeletal: Sternum appears intact. No evidence for acute rib fracture. See separately dictated thoracic CT CT ABDOMEN PELVIS FINDINGS Hepatobiliary: No calcified gallstone. No biliary dilatation. No focal hepatic abnormality. Pancreas: Unremarkable. No pancreatic ductal dilatation or surrounding inflammatory changes. Spleen: Normal in size without focal abnormality. Adrenals/Urinary Tract: Adrenal glands are within normal limits. Kidneys show no hydronephrosis. Cyst in the right kidney, no imaging follow-up is recommended. Small nonobstructing right kidney stone. The bladder is normal. Stomach/Bowel: The stomach is nonenlarged. There is no dilated small bowel. No acute bowel wall thickening. Vascular/Lymphatic: Mild aortic atherosclerosis. No aneurysm. Partial linear filling defect within the infrarenal abdominal aorta series 6,  image 79, appearance on coronal views suggest possible penetrating aortic ulcer, series 8, image 57. No aneurysmal dilatation at this level. No surrounding hematoma or fat stranding. Reproductive: Prostate is unremarkable. Other: Negative for pelvic effusion or free air. Musculoskeletal: Acute L3 fracture, see separately dictated spine CT. No evidence for a pelvic fracture. IMPRESSION: 1. No CT evidence for acute intrathoracic abnormality. 2. 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 Other consideration could include short segment focal dissection. No aneurysmal dilatation, no evidence for Peri aortic stranding or Peri aortic hematoma. Findings could be secondary to chronic penetrating aortic ulcer however this is noted to be at the level of lumbar spine fracture and IMA origin. Although this appearance would be unusual  for an acute aortic injury, given adjacent lumbar spine fracture, consider short-term follow-up CT angiography to ensure no progression. 3. Aortic atherosclerosis. Critical Value/emergent results were called by telephone at the time of interpretation on 07/20/2024 at 6:03 pm to provider JULIE HAVILAND , who verbally acknowledged these results. Aortic Atherosclerosis (ICD10-I70.0). Electronically Signed   By: Luke Bun M.D.   On: 07/20/2024 18:03   CT T-SPINE NO CHARGE Result Date: 07/20/2024 CLINICAL DATA:  Trauma.  Fall from 20-25 feet while cutting a tree. EXAM: CT THORACIC AND LUMBAR SPINE WITH CONTRAST TECHNIQUE: Multiplanar CT images of the thoracic and lumbar spine were reconstructed from contemporary CT of the Chest, Abdomen, and Pelvis. RADIATION DOSE REDUCTION: This exam was performed according to the departmental dose-optimization program which includes automated exposure control, adjustment of the mA and/or kV according to patient size and/or use of iterative reconstruction technique.  CONTRAST:  No additional. COMPARISON:  CT abdomen and pelvis 08/25/2021 FINDINGS: CT THORACIC SPINE FINDINGS Alignment: Mild-to-moderate upper thoracic levoscoliosis and mildly increased upper thoracic kyphosis. No listhesis. Vertebrae: No acute fracture or suspicious lesion. Paraspinal and other soft tissues: No acute abnormality in the paraspinal soft tissues. Main der of the chest reported separately. Disc levels: Mild thoracic spondylosis. No evidence of high-grade spinal canal stenosis. Facet arthrosis which is asymmetrically advanced on the right at T4-5 greater than T5-6 with mild right neural foraminal stenosis at the former. CT LUMBAR SPINE FINDINGS Segmentation: 5 lumbar type vertebrae. Alignment: No significant listhesis. Vertebrae: Acute burst fracture of the L3 vertebral body with 60% height loss centrally and 8 mm of retropulsion. No posterior element fracture. Unchanged mild chronic L1 superior endplate compression fracture. Paraspinal and other soft tissues: Mild paravertebral soft tissue swelling or hematoma at L3. Remainder of the abdomen and pelvis reported separately. Disc levels: L3 retropulsion results in severe spinal stenosis (6 mm AP spinal canal diameter). Mild spondylosis elsewhere. Disc bulging and facet and ligamentum flavum hypertrophy at L4-5 result in potentially moderate spinal stenosis. IMPRESSION: 1. Acute L3 burst fracture with 60% height loss and 8 mm of retropulsion resulting in severe spinal stenosis. 2. No acute fracture in the thoracic spine. Electronically Signed   By: Dasie Hamburg M.D.   On: 07/20/2024 17:50   CT L-SPINE NO CHARGE Result Date: 07/20/2024 CLINICAL DATA:  Trauma.  Fall from 20-25 feet while cutting a tree. EXAM: CT THORACIC AND LUMBAR SPINE WITH CONTRAST TECHNIQUE: Multiplanar CT images of the thoracic and lumbar spine were reconstructed from contemporary CT of the Chest, Abdomen, and Pelvis. RADIATION DOSE REDUCTION: This exam was performed according to  the departmental dose-optimization program which includes automated exposure control, adjustment of the mA and/or kV according to patient size and/or use of iterative reconstruction technique. CONTRAST:  No additional. COMPARISON:  CT abdomen and pelvis 08/25/2021 FINDINGS: CT THORACIC SPINE FINDINGS Alignment: Mild-to-moderate upper thoracic levoscoliosis and mildly increased upper thoracic kyphosis. No listhesis. Vertebrae: No acute fracture or suspicious lesion. Paraspinal and other soft tissues: No acute abnormality in the paraspinal soft tissues. Main der of the chest reported separately. Disc levels: Mild thoracic spondylosis. No evidence of high-grade spinal canal stenosis. Facet arthrosis which is asymmetrically advanced on the right at T4-5 greater than T5-6 with mild right neural foraminal stenosis at the former. CT LUMBAR SPINE FINDINGS Segmentation: 5 lumbar type vertebrae. Alignment: No significant listhesis. Vertebrae: Acute burst fracture of the L3 vertebral body with 60% height loss centrally and 8 mm of retropulsion. No posterior element fracture. Unchanged mild  chronic L1 superior endplate compression fracture. Paraspinal and other soft tissues: Mild paravertebral soft tissue swelling or hematoma at L3. Remainder of the abdomen and pelvis reported separately. Disc levels: L3 retropulsion results in severe spinal stenosis (6 mm AP spinal canal diameter). Mild spondylosis elsewhere. Disc bulging and facet and ligamentum flavum hypertrophy at L4-5 result in potentially moderate spinal stenosis. IMPRESSION: 1. Acute L3 burst fracture with 60% height loss and 8 mm of retropulsion resulting in severe spinal stenosis. 2. No acute fracture in the thoracic spine. Electronically Signed   By: Dasie Hamburg M.D.   On: 07/20/2024 17:50   CT HEAD WO CONTRAST Result Date: 07/20/2024 CLINICAL DATA:  Head trauma, moderate-severe; Polytrauma, blunt. Fall from 20-25 feet while cutting a tree. On blood thinners.  EXAM: CT HEAD WITHOUT CONTRAST CT CERVICAL SPINE WITHOUT CONTRAST TECHNIQUE: Multidetector CT imaging of the head and cervical spine was performed following the standard protocol without intravenous contrast. Multiplanar CT image reconstructions of the cervical spine were also generated. RADIATION DOSE REDUCTION: This exam was performed according to the departmental dose-optimization program which includes automated exposure control, adjustment of the mA and/or kV according to patient size and/or use of iterative reconstruction technique. COMPARISON:  Head MRI 09/22/2021 FINDINGS: CT HEAD FINDINGS Brain: There is no evidence of an acute infarct, intracranial hemorrhage, midline shift, or extra-axial fluid collection. Cerebral volume is normal. The ventricles are normal in size. 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 Vascular: No hyperdense vessel. Skull: No acute fracture or suspicious lesion. Sinuses/Orbits: Small mucous retention cyst in the right maxillary sinus. Clear mastoid air cells. Unremarkable orbits. Other: Right occipital scalp hematoma. CT CERVICAL SPINE FINDINGS Alignment: Normal. Skull base and vertebrae: No acute fracture or suspicious lesion. Soft tissues and spinal canal: No prevertebral fluid or swelling. No visible canal hematoma. Disc levels: Mild cervical spondylosis. No evidence of high-grade spinal canal stenosis. Upper chest: More fully evaluated on the separately reported contemporaneous CT of the chest, abdomen, and pelvis. Other: None. IMPRESSION: 1. No evidence of acute intracranial abnormality or cervical spine fracture. 2. Right occipital scalp hematoma. 3. Known right-sided vestibular schwannoma. Electronically Signed   By: Dasie Hamburg M.D.   On: 07/20/2024 17:41   CT CERVICAL SPINE WO CONTRAST Result  Date: 07/20/2024 CLINICAL DATA:  Head trauma, moderate-severe; Polytrauma, blunt. Fall from 20-25 feet while cutting a tree. On blood thinners. EXAM: CT HEAD WITHOUT CONTRAST CT CERVICAL SPINE WITHOUT CONTRAST TECHNIQUE: Multidetector CT imaging of the head and cervical spine was performed following the standard protocol without intravenous contrast. Multiplanar CT image reconstructions of the cervical spine were also generated. RADIATION DOSE REDUCTION: This exam was performed according to the departmental dose-optimization program which includes automated exposure control, adjustment of the mA and/or kV according to patient size and/or use of iterative reconstruction technique. COMPARISON:  Head MRI 09/22/2021 FINDINGS: CT HEAD FINDINGS Brain: There is no evidence of an acute infarct, intracranial hemorrhage, midline shift, or extra-axial fluid collection. Cerebral volume is normal. The ventricles are normal in size. 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 Vascular: No hyperdense vessel. Skull: No acute fracture or suspicious lesion. Sinuses/Orbits: Small mucous retention cyst in the right maxillary  sinus. Clear mastoid air cells. Unremarkable orbits. Other: Right occipital scalp hematoma. CT CERVICAL SPINE FINDINGS Alignment: Normal. Skull base and vertebrae: No acute fracture or suspicious lesion. Soft tissues and spinal canal: No prevertebral fluid or swelling. No visible canal hematoma. Disc levels: Mild cervical spondylosis. No evidence of high-grade spinal canal stenosis. Upper chest: More fully evaluated on the separately reported contemporaneous CT of the chest, abdomen, and pelvis. Other: None. IMPRESSION: 1. No evidence of acute intracranial abnormality or cervical spine fracture. 2. Right occipital scalp hematoma. 3. Known  right-sided vestibular schwannoma. Electronically Signed   By: Dasie Hamburg M.D.   On: 07/20/2024 17:41   DG Pelvis Portable Result Date: 07/20/2024 CLINICAL DATA:  Trauma EXAM: PORTABLE PELVIS 1-2 VIEWS COMPARISON:  None Available. FINDINGS: There is no evidence of pelvic fracture or diastasis. No pelvic bone lesions are seen. IMPRESSION: No fracture or dislocation. Electronically Signed   By: Jackquline Boxer M.D.   On: 07/20/2024 17:00   DG Chest Port 1 View Result Date: 07/20/2024 CLINICAL DATA:  Trauma EXAM: PORTABLE CHEST 1 VIEW COMPARISON:  None Available. FINDINGS: Normal mediastinum and cardiac silhouette. Normal pulmonary vasculature. No evidence of effusion, infiltrate, or pneumothorax. No acute bony abnormality. IMPRESSION: No acute cardiopulmonary process. Electronically Signed   By: Jackquline Boxer M.D.   On: 07/20/2024 16:59    Antibiotics:  Anti-infectives (From admission, onward)    Start     Dose/Rate Route Frequency Ordered Stop   07/25/24 0000  ceFAZolin  (ANCEF ) IVPB 2g/100 mL premix        2 g 200 mL/hr over 30 Minutes Intravenous Every 8 hours 07/24/24 2121 07/25/24 1559   07/24/24 1545  ceFAZolin  (ANCEF ) IVPB 2g/100 mL premix        2 g 200 mL/hr over 30 Minutes Intravenous  Once 07/24/24 1539 07/24/24 1649   07/24/24 1533  ceFAZolin  (ANCEF ) 2-4 GM/100ML-% IVPB       Note to Pharmacy: Junette Massa A: cabinet override      07/24/24 1533 07/24/24 1635       Discharge Exam: Blood pressure 128/72, pulse 72, temperature 98.6 F (37 C), temperature source Oral, resp. rate 18, height 5' 10 (1.778 m), weight 81.2 kg, SpO2 93%. Neurologic: Grossly normal Dressing dry  Discharge Medications:   Allergies as of 07/25/2024       Reactions   Codeine Nausea Only        Medication List     TAKE these medications    atenolol  50 MG tablet Commonly known as: TENORMIN  Take 1 tablet (50 mg total) by mouth daily.   methocarbamol  500 MG tablet Commonly known as:  ROBAXIN  Take 1 tablet (500 mg total) by mouth every 6 (six) hours as needed for muscle spasms.   Oxycodone  HCl 10 MG Tabs Take 1 tablet (10 mg total) by mouth every 4 (four) hours as needed for severe pain (pain score 7-10).               Durable Medical Equipment  (From admission, onward)           Start     Ordered   07/25/24 0819  DME 3-in-1  Once        07/25/24 9180   07/25/24 0819  DME Walker  Once       Question Answer Comment  Walker: With 5 Inch Wheels   Patient needs a walker to treat with the following condition Lumbar compression fracture (HCC)      07/25/24  9180   07/24/24 2122  DME Walker rolling  Once       Question:  Patient needs a walker to treat with the following condition  Answer:  S/P lumbar fusion   07/24/24 2121   07/24/24 2122  DME 3 n 1  Once        07/24/24 2121              Discharge Care Instructions  (From admission, onward)           Start     Ordered   07/25/24 0000  Discharge wound care:       Comments: Remove dressing in 1 wk   07/25/24 9180            Disposition: home   Final Dx: ORIF L3 fracture  Discharge Instructions     Call MD for:  difficulty breathing, headache or visual disturbances   Complete by: As directed    Call MD for:  persistant nausea and vomiting   Complete by: As directed    Call MD for:  redness, tenderness, or signs of infection (pain, swelling, redness, odor or green/yellow discharge around incision site)   Complete by: As directed    Call MD for:  severe uncontrolled pain   Complete by: As directed    Call MD for:  temperature >100.4   Complete by: As directed    Diet - low sodium heart healthy   Complete by: As directed    Discharge wound care:   Complete by: As directed    Remove dressing in 1 wk   Increase activity slowly   Complete by: As directed         Follow-up Information     Joshua Alm Hamilton, MD. Schedule an appointment as soon as possible for a visit in 2  week(s).   Specialty: Neurosurgery Contact information: [address] [place] [address] [phone]                  Signed: Alm GORMAN Joshua 07/25/2024, 8:19 AM

## 2024-07-25 NOTE — Plan of Care (Signed)
  Problem: Health Behavior/Discharge Planning: Goal: Ability to manage health-related needs will improve Outcome: Progressing   Problem: Clinical Measurements: Goal: Will remain free from infection Outcome: Progressing   Problem: Clinical Measurements: Goal: Respiratory complications will improve Outcome: Progressing   Problem: Activity: Goal: Risk for activity intolerance will decrease Outcome: Progressing   Problem: Nutrition: Goal: Adequate nutrition will be maintained Outcome: Progressing   Problem: Elimination: Goal: Will not experience complications related to bowel motility Outcome: Progressing   Problem: Safety: Goal: Ability to remain free from injury will improve Outcome: Progressing   Problem: Skin Integrity: Goal: Risk for impaired skin integrity will decrease Outcome: Progressing   Problem: Pain Management: Goal: Pain level will decrease Outcome: Progressing

## 2024-07-29 DIAGNOSIS — H9191 Unspecified hearing loss, right ear: Secondary | ICD-10-CM | POA: Diagnosis not present

## 2024-07-29 DIAGNOSIS — Z556 Problems related to health literacy: Secondary | ICD-10-CM | POA: Diagnosis not present

## 2024-07-29 DIAGNOSIS — Z87442 Personal history of urinary calculi: Secondary | ICD-10-CM | POA: Diagnosis not present

## 2024-07-29 DIAGNOSIS — E7800 Pure hypercholesterolemia, unspecified: Secondary | ICD-10-CM | POA: Diagnosis not present

## 2024-07-29 DIAGNOSIS — Z9181 History of falling: Secondary | ICD-10-CM | POA: Diagnosis not present

## 2024-07-29 DIAGNOSIS — D333 Benign neoplasm of cranial nerves: Secondary | ICD-10-CM | POA: Diagnosis not present

## 2024-07-29 DIAGNOSIS — S32032D Unstable burst fracture of third lumbar vertebra, subsequent encounter for fracture with routine healing: Secondary | ICD-10-CM | POA: Diagnosis not present

## 2024-07-29 DIAGNOSIS — Z981 Arthrodesis status: Secondary | ICD-10-CM | POA: Diagnosis not present

## 2024-07-29 DIAGNOSIS — Z86718 Personal history of other venous thrombosis and embolism: Secondary | ICD-10-CM | POA: Diagnosis not present

## 2024-07-29 DIAGNOSIS — I10 Essential (primary) hypertension: Secondary | ICD-10-CM | POA: Diagnosis not present

## 2024-07-30 ENCOUNTER — Telehealth: Payer: Self-pay

## 2024-07-30 DIAGNOSIS — S32032D Unstable burst fracture of third lumbar vertebra, subsequent encounter for fracture with routine healing: Secondary | ICD-10-CM | POA: Diagnosis not present

## 2024-07-30 DIAGNOSIS — Z86718 Personal history of other venous thrombosis and embolism: Secondary | ICD-10-CM | POA: Diagnosis not present

## 2024-07-30 DIAGNOSIS — D333 Benign neoplasm of cranial nerves: Secondary | ICD-10-CM | POA: Diagnosis not present

## 2024-07-30 DIAGNOSIS — Z556 Problems related to health literacy: Secondary | ICD-10-CM | POA: Diagnosis not present

## 2024-07-30 DIAGNOSIS — H9191 Unspecified hearing loss, right ear: Secondary | ICD-10-CM | POA: Diagnosis not present

## 2024-07-30 DIAGNOSIS — I10 Essential (primary) hypertension: Secondary | ICD-10-CM | POA: Diagnosis not present

## 2024-07-30 DIAGNOSIS — Z9181 History of falling: Secondary | ICD-10-CM | POA: Diagnosis not present

## 2024-07-30 DIAGNOSIS — Z87442 Personal history of urinary calculi: Secondary | ICD-10-CM | POA: Diagnosis not present

## 2024-07-30 DIAGNOSIS — E7800 Pure hypercholesterolemia, unspecified: Secondary | ICD-10-CM | POA: Diagnosis not present

## 2024-07-30 DIAGNOSIS — Z981 Arthrodesis status: Secondary | ICD-10-CM | POA: Diagnosis not present

## 2024-07-30 NOTE — Telephone Encounter (Signed)
 Copied from CRM [id number]. Topic: Clinical - Home Health Verbal Orders >> Jul 30, 2024  2:47 PM Dedra NOVAK wrote: Caller/Agency: Tanner from [facility] Callback Number: [phone] Service Requested: Physical Therapy Frequency: Twice per wk for 2 wks,  once per wk for 4 wks for balance training and gait training after back surgery Any new concerns about the patient? No

## 2024-07-30 NOTE — Telephone Encounter (Signed)
 Please okay these orders  ?

## 2024-07-31 NOTE — Telephone Encounter (Signed)
 Spoke with Daniel Foster with [facility] advised that VO orders requested were approved by Dr Johnny

## 2024-08-02 DIAGNOSIS — I10 Essential (primary) hypertension: Secondary | ICD-10-CM | POA: Diagnosis not present

## 2024-08-02 DIAGNOSIS — Z556 Problems related to health literacy: Secondary | ICD-10-CM | POA: Diagnosis not present

## 2024-08-02 DIAGNOSIS — Z981 Arthrodesis status: Secondary | ICD-10-CM | POA: Diagnosis not present

## 2024-08-02 DIAGNOSIS — Z87442 Personal history of urinary calculi: Secondary | ICD-10-CM | POA: Diagnosis not present

## 2024-08-02 DIAGNOSIS — Z86718 Personal history of other venous thrombosis and embolism: Secondary | ICD-10-CM | POA: Diagnosis not present

## 2024-08-02 DIAGNOSIS — E7800 Pure hypercholesterolemia, unspecified: Secondary | ICD-10-CM | POA: Diagnosis not present

## 2024-08-02 DIAGNOSIS — D333 Benign neoplasm of cranial nerves: Secondary | ICD-10-CM | POA: Diagnosis not present

## 2024-08-02 DIAGNOSIS — S32032D Unstable burst fracture of third lumbar vertebra, subsequent encounter for fracture with routine healing: Secondary | ICD-10-CM | POA: Diagnosis not present

## 2024-08-02 DIAGNOSIS — H9191 Unspecified hearing loss, right ear: Secondary | ICD-10-CM | POA: Diagnosis not present

## 2024-08-02 DIAGNOSIS — Z9181 History of falling: Secondary | ICD-10-CM | POA: Diagnosis not present

## 2024-08-06 ENCOUNTER — Telehealth: Payer: Self-pay

## 2024-08-06 NOTE — Telephone Encounter (Signed)
 He can take 1/2 a pill every day. Report back in 2 weeks what the BP readings are

## 2024-08-06 NOTE — Telephone Encounter (Signed)
 Copied from CRM [id number]. Topic: Clinical - Medication Question >> Aug 06, 2024  9:04 AM Daniel Foster DEL wrote: Reason for CRM: Patient called in stating that he has been having wonderful blood pressure readings, and would like to know should he discontinue or cut in half  atenolol  (TENORMIN ) 50 MG tablet? He just had a back surgery on August 20th. He's not mobile at the moment.

## 2024-08-07 DIAGNOSIS — Z556 Problems related to health literacy: Secondary | ICD-10-CM | POA: Diagnosis not present

## 2024-08-07 DIAGNOSIS — H9191 Unspecified hearing loss, right ear: Secondary | ICD-10-CM | POA: Diagnosis not present

## 2024-08-07 DIAGNOSIS — Z87442 Personal history of urinary calculi: Secondary | ICD-10-CM | POA: Diagnosis not present

## 2024-08-07 DIAGNOSIS — E7800 Pure hypercholesterolemia, unspecified: Secondary | ICD-10-CM | POA: Diagnosis not present

## 2024-08-07 DIAGNOSIS — Z981 Arthrodesis status: Secondary | ICD-10-CM | POA: Diagnosis not present

## 2024-08-07 DIAGNOSIS — Z86718 Personal history of other venous thrombosis and embolism: Secondary | ICD-10-CM | POA: Diagnosis not present

## 2024-08-07 DIAGNOSIS — I10 Essential (primary) hypertension: Secondary | ICD-10-CM | POA: Diagnosis not present

## 2024-08-07 DIAGNOSIS — Z9181 History of falling: Secondary | ICD-10-CM | POA: Diagnosis not present

## 2024-08-07 DIAGNOSIS — D333 Benign neoplasm of cranial nerves: Secondary | ICD-10-CM | POA: Diagnosis not present

## 2024-08-07 DIAGNOSIS — S32032D Unstable burst fracture of third lumbar vertebra, subsequent encounter for fracture with routine healing: Secondary | ICD-10-CM | POA: Diagnosis not present

## 2024-08-08 NOTE — Telephone Encounter (Signed)
 Pt advised of Dr Johnny recommendation voiced understanding

## 2024-08-09 DIAGNOSIS — I10 Essential (primary) hypertension: Secondary | ICD-10-CM | POA: Diagnosis not present

## 2024-08-09 DIAGNOSIS — Z981 Arthrodesis status: Secondary | ICD-10-CM | POA: Diagnosis not present

## 2024-08-09 DIAGNOSIS — Z86718 Personal history of other venous thrombosis and embolism: Secondary | ICD-10-CM | POA: Diagnosis not present

## 2024-08-09 DIAGNOSIS — S32032D Unstable burst fracture of third lumbar vertebra, subsequent encounter for fracture with routine healing: Secondary | ICD-10-CM | POA: Diagnosis not present

## 2024-08-09 DIAGNOSIS — E7800 Pure hypercholesterolemia, unspecified: Secondary | ICD-10-CM | POA: Diagnosis not present

## 2024-08-09 DIAGNOSIS — Z9181 History of falling: Secondary | ICD-10-CM | POA: Diagnosis not present

## 2024-08-09 DIAGNOSIS — Z556 Problems related to health literacy: Secondary | ICD-10-CM | POA: Diagnosis not present

## 2024-08-09 DIAGNOSIS — Z87442 Personal history of urinary calculi: Secondary | ICD-10-CM | POA: Diagnosis not present

## 2024-08-09 DIAGNOSIS — H9191 Unspecified hearing loss, right ear: Secondary | ICD-10-CM | POA: Diagnosis not present

## 2024-08-09 DIAGNOSIS — D333 Benign neoplasm of cranial nerves: Secondary | ICD-10-CM | POA: Diagnosis not present

## 2024-08-13 DIAGNOSIS — S32032D Unstable burst fracture of third lumbar vertebra, subsequent encounter for fracture with routine healing: Secondary | ICD-10-CM | POA: Diagnosis not present

## 2024-08-13 DIAGNOSIS — E7800 Pure hypercholesterolemia, unspecified: Secondary | ICD-10-CM | POA: Diagnosis not present

## 2024-08-13 DIAGNOSIS — Z87442 Personal history of urinary calculi: Secondary | ICD-10-CM | POA: Diagnosis not present

## 2024-08-13 DIAGNOSIS — I10 Essential (primary) hypertension: Secondary | ICD-10-CM | POA: Diagnosis not present

## 2024-08-13 DIAGNOSIS — Z9181 History of falling: Secondary | ICD-10-CM | POA: Diagnosis not present

## 2024-08-13 DIAGNOSIS — Z86718 Personal history of other venous thrombosis and embolism: Secondary | ICD-10-CM | POA: Diagnosis not present

## 2024-08-13 DIAGNOSIS — Z981 Arthrodesis status: Secondary | ICD-10-CM | POA: Diagnosis not present

## 2024-08-13 DIAGNOSIS — Z556 Problems related to health literacy: Secondary | ICD-10-CM | POA: Diagnosis not present

## 2024-08-13 DIAGNOSIS — H9191 Unspecified hearing loss, right ear: Secondary | ICD-10-CM | POA: Diagnosis not present

## 2024-08-13 DIAGNOSIS — D333 Benign neoplasm of cranial nerves: Secondary | ICD-10-CM | POA: Diagnosis not present

## 2024-08-15 DIAGNOSIS — S32032D Unstable burst fracture of third lumbar vertebra, subsequent encounter for fracture with routine healing: Secondary | ICD-10-CM | POA: Diagnosis not present

## 2024-08-19 DIAGNOSIS — Z86718 Personal history of other venous thrombosis and embolism: Secondary | ICD-10-CM | POA: Diagnosis not present

## 2024-08-19 DIAGNOSIS — Z87442 Personal history of urinary calculi: Secondary | ICD-10-CM | POA: Diagnosis not present

## 2024-08-19 DIAGNOSIS — Z9181 History of falling: Secondary | ICD-10-CM | POA: Diagnosis not present

## 2024-08-19 DIAGNOSIS — D333 Benign neoplasm of cranial nerves: Secondary | ICD-10-CM | POA: Diagnosis not present

## 2024-08-19 DIAGNOSIS — S32032D Unstable burst fracture of third lumbar vertebra, subsequent encounter for fracture with routine healing: Secondary | ICD-10-CM | POA: Diagnosis not present

## 2024-08-19 DIAGNOSIS — Z981 Arthrodesis status: Secondary | ICD-10-CM | POA: Diagnosis not present

## 2024-08-19 DIAGNOSIS — I10 Essential (primary) hypertension: Secondary | ICD-10-CM | POA: Diagnosis not present

## 2024-08-19 DIAGNOSIS — H9191 Unspecified hearing loss, right ear: Secondary | ICD-10-CM | POA: Diagnosis not present

## 2024-08-19 DIAGNOSIS — E7800 Pure hypercholesterolemia, unspecified: Secondary | ICD-10-CM | POA: Diagnosis not present

## 2024-08-19 DIAGNOSIS — Z556 Problems related to health literacy: Secondary | ICD-10-CM | POA: Diagnosis not present

## 2024-08-20 ENCOUNTER — Telehealth: Payer: Self-pay

## 2024-08-20 NOTE — Telephone Encounter (Signed)
 Copied from CRM [id number]. Topic: Clinical - Medication Question >> Aug 06, 2024  9:04 AM Burnard DEL wrote: Reason for CRM: Patient called in stating that he has been having wonderful blood pressure readings, and would like to know should he discontinue or cut in half  atenolol  (TENORMIN ) 50 MG tablet? He just had a back surgery on August 20th. He's not mobile at the moment. >> Aug 16, 2024  3:37 PM Drema MATSU wrote: Patient has been monitoring his blood pressure for a week. Patient readings with no  atenolol  (TENORMIN ) 50 MG tablet   *Patient feels like heart rate is faster. Resting rate was constantly below 60 but not now. When he shoots up and walk down the hall it goes 90s to low 100s sometimes   09/02 128/67 PM reading 09/03/125/70 AM reading 09/04 130/78 PM Reading 09/05 113/77 AM Reading 09/06 121/76 AM Reading 09/07 126/70 AM Reading  09/08 136/77  PM Reading 09/09 116/77 AM Reading 09/10 138/73 PM Reading 09/11 124/67 AM Reading 09/12 122/80 AM Reading

## 2024-08-20 NOTE — Telephone Encounter (Signed)
 He should take 1/2 a tablet of Atenolol  daily for a few weeks, then report back with his BP readings

## 2024-08-22 ENCOUNTER — Telehealth: Payer: Self-pay | Admitting: Family Medicine

## 2024-08-22 NOTE — Telephone Encounter (Signed)
 Copied from CRM #[id number]. Topic: General - Other >> Aug 22, 2024  4:20 PM Zebedee SAUNDERS wrote: Reason for CRM: Pt returning [place], Nancy N, CMA call provided message. Pt want to know why he should take half of Atenolol . Please call pt at [phone].

## 2024-08-23 NOTE — Telephone Encounter (Signed)
 Spoke with pt advised of Dr Johnny recommendation, voiced understanding.

## 2024-08-28 DIAGNOSIS — Z87442 Personal history of urinary calculi: Secondary | ICD-10-CM | POA: Diagnosis not present

## 2024-08-28 DIAGNOSIS — Z86718 Personal history of other venous thrombosis and embolism: Secondary | ICD-10-CM | POA: Diagnosis not present

## 2024-08-28 DIAGNOSIS — D333 Benign neoplasm of cranial nerves: Secondary | ICD-10-CM | POA: Diagnosis not present

## 2024-08-28 DIAGNOSIS — H9191 Unspecified hearing loss, right ear: Secondary | ICD-10-CM | POA: Diagnosis not present

## 2024-08-28 DIAGNOSIS — Z9181 History of falling: Secondary | ICD-10-CM | POA: Diagnosis not present

## 2024-08-28 DIAGNOSIS — I10 Essential (primary) hypertension: Secondary | ICD-10-CM | POA: Diagnosis not present

## 2024-08-28 DIAGNOSIS — Z981 Arthrodesis status: Secondary | ICD-10-CM | POA: Diagnosis not present

## 2024-08-28 DIAGNOSIS — E7800 Pure hypercholesterolemia, unspecified: Secondary | ICD-10-CM | POA: Diagnosis not present

## 2024-08-28 DIAGNOSIS — S32032D Unstable burst fracture of third lumbar vertebra, subsequent encounter for fracture with routine healing: Secondary | ICD-10-CM | POA: Diagnosis not present

## 2024-08-28 DIAGNOSIS — Z556 Problems related to health literacy: Secondary | ICD-10-CM | POA: Diagnosis not present

## 2024-08-29 ENCOUNTER — Ambulatory Visit: Admitting: Family Medicine

## 2024-08-29 ENCOUNTER — Encounter: Payer: Self-pay | Admitting: Family Medicine

## 2024-08-29 VITALS — BP 102/70 | HR 54 | Temp 98.8°F | Ht 70.0 in | Wt 160.0 lb

## 2024-08-29 DIAGNOSIS — N138 Other obstructive and reflux uropathy: Secondary | ICD-10-CM

## 2024-08-29 DIAGNOSIS — I10 Essential (primary) hypertension: Secondary | ICD-10-CM

## 2024-08-29 DIAGNOSIS — H9191 Unspecified hearing loss, right ear: Secondary | ICD-10-CM | POA: Diagnosis not present

## 2024-08-29 DIAGNOSIS — E7800 Pure hypercholesterolemia, unspecified: Secondary | ICD-10-CM

## 2024-08-29 DIAGNOSIS — R739 Hyperglycemia, unspecified: Secondary | ICD-10-CM

## 2024-08-29 DIAGNOSIS — Z87442 Personal history of urinary calculi: Secondary | ICD-10-CM | POA: Diagnosis not present

## 2024-08-29 DIAGNOSIS — Z23 Encounter for immunization: Secondary | ICD-10-CM | POA: Diagnosis not present

## 2024-08-29 DIAGNOSIS — N401 Enlarged prostate with lower urinary tract symptoms: Secondary | ICD-10-CM | POA: Diagnosis not present

## 2024-08-29 DIAGNOSIS — S32032D Unstable burst fracture of third lumbar vertebra, subsequent encounter for fracture with routine healing: Secondary | ICD-10-CM | POA: Diagnosis not present

## 2024-08-29 DIAGNOSIS — Z9181 History of falling: Secondary | ICD-10-CM | POA: Diagnosis not present

## 2024-08-29 DIAGNOSIS — Z556 Problems related to health literacy: Secondary | ICD-10-CM | POA: Diagnosis not present

## 2024-08-29 DIAGNOSIS — Z86718 Personal history of other venous thrombosis and embolism: Secondary | ICD-10-CM | POA: Diagnosis not present

## 2024-08-29 DIAGNOSIS — Z981 Arthrodesis status: Secondary | ICD-10-CM | POA: Diagnosis not present

## 2024-08-29 DIAGNOSIS — D333 Benign neoplasm of cranial nerves: Secondary | ICD-10-CM | POA: Diagnosis not present

## 2024-08-29 LAB — CBC WITH DIFFERENTIAL/PLATELET
Basophils Absolute: 0 K/uL (ref 0.0–0.1)
Basophils Relative: 0.9 % (ref 0.0–3.0)
Eosinophils Absolute: 0.1 K/uL (ref 0.0–0.7)
Eosinophils Relative: 2.1 % (ref 0.0–5.0)
HCT: 43.1 % (ref 39.0–52.0)
Hemoglobin: 14.3 g/dL (ref 13.0–17.0)
Lymphocytes Relative: 18.5 % (ref 12.0–46.0)
Lymphs Abs: 0.9 K/uL (ref 0.7–4.0)
MCHC: 33.1 g/dL (ref 30.0–36.0)
MCV: 86.4 fl (ref 78.0–100.0)
Monocytes Absolute: 0.9 K/uL (ref 0.1–1.0)
Monocytes Relative: 17.5 % — ABNORMAL HIGH (ref 3.0–12.0)
Neutro Abs: 3 K/uL (ref 1.4–7.7)
Neutrophils Relative %: 61 % (ref 43.0–77.0)
Platelets: 210 K/uL (ref 150.0–400.0)
RBC: 4.99 Mil/uL (ref 4.22–5.81)
RDW: 13 % (ref 11.5–15.5)
WBC: 5 K/uL (ref 4.0–10.5)

## 2024-08-29 LAB — PSA: PSA: 2.57 ng/mL (ref 0.10–4.00)

## 2024-08-29 LAB — BASIC METABOLIC PANEL WITH GFR
BUN: 13 mg/dL (ref 6–23)
CO2: 29 meq/L (ref 19–32)
Calcium: 9.3 mg/dL (ref 8.4–10.5)
Chloride: 102 meq/L (ref 96–112)
Creatinine, Ser: 1.08 mg/dL (ref 0.40–1.50)
GFR: 72.19 mL/min (ref >60.00)
Glucose, Bld: 89 mg/dL (ref 70–99)
Potassium: 4 meq/L (ref 3.5–5.1)
Sodium: 140 meq/L (ref 135–145)

## 2024-08-29 LAB — HEPATIC FUNCTION PANEL
ALT: 14 U/L (ref 0–53)
AST: 19 U/L (ref 0–37)
Albumin: 4.3 g/dL (ref 3.5–5.2)
Alkaline Phosphatase: 94 U/L (ref 39–117)
Bilirubin, Direct: 0.2 mg/dL (ref 0.0–0.3)
Total Bilirubin: 1.1 mg/dL (ref 0.2–1.2)
Total Protein: 6.4 g/dL (ref 6.0–8.3)

## 2024-08-29 LAB — LIPID PANEL
Cholesterol: 188 mg/dL (ref 0–200)
HDL: 37.6 mg/dL — ABNORMAL LOW (ref >39.00)
LDL Cholesterol: 131 mg/dL — ABNORMAL HIGH (ref 0–99)
NonHDL: 150.39
Total CHOL/HDL Ratio: 5
Triglycerides: 97 mg/dL (ref 0.0–149.0)
VLDL: 19.4 mg/dL (ref 0.0–40.0)

## 2024-08-29 LAB — HEMOGLOBIN A1C: Hgb A1c MFr Bld: 5.4 % (ref 4.6–6.5)

## 2024-08-29 LAB — TSH: TSH: 2.15 u[IU]/mL (ref 0.35–5.50)

## 2024-08-29 NOTE — Progress Notes (Signed)
 Subjective:    Patient ID: Daniel Foster, male    DOB: 07/25/1961, 63 y.o.   MRN: [id number]  HPI Here to follow up on issues. He had a lumbar fusion surgery to l2-3 and L3-4 on 07-24-24 per Dr. Alm Molt. This was to repair a burst fracture after he fell off a ladder. He is doing well now and going to PT. Over the past few months, he noticed his BP was going low in the 100-110 range systolic. He stopped the Atenolol  for a week, and his BP went up to the 120's to 130's systolic.    Review of Systems  Constitutional: Negative.   HENT: Negative.    Eyes: Negative.   Respiratory: Negative.    Cardiovascular: Negative.   Gastrointestinal: Negative.   Genitourinary: Negative.   Musculoskeletal:  Positive for back pain.  Skin: Negative.   Neurological: Negative.   Psychiatric/Behavioral: Negative.         Objective:   Physical Exam Constitutional:      General: He is not in acute distress.    Appearance: Normal appearance. He is well-developed. He is not diaphoretic.  HENT:     Head: Normocephalic and atraumatic.     Right Ear: External ear normal.     Left Ear: External ear normal.     Nose: Nose normal.     Mouth/Throat:     Pharynx: No oropharyngeal exudate.  Eyes:     General: No scleral icterus.       Right eye: No discharge.        Left eye: No discharge.     Conjunctiva/sclera: Conjunctivae normal.     Pupils: Pupils are equal, round, and reactive to light.  Neck:     Thyroid : No thyromegaly.     Vascular: No JVD.     Trachea: No tracheal deviation.  Cardiovascular:     Rate and Rhythm: Normal rate and regular rhythm.     Pulses: Normal pulses.     Heart sounds: Normal heart sounds. No murmur heard.    No friction rub. No gallop.  Pulmonary:     Effort: Pulmonary effort is normal. No respiratory distress.     Breath sounds: Normal breath sounds. No wheezing or rales.  Chest:     Chest wall: No tenderness.  Abdominal:     General: Bowel sounds are normal.  There is no distension.     Palpations: Abdomen is soft. There is no mass.     Tenderness: There is no abdominal tenderness. There is no guarding or rebound.  Genitourinary:    Penis: Normal. No tenderness.      Testes: Normal.     Prostate: Normal.     Rectum: Normal. Guaiac result negative.  Musculoskeletal:        General: No tenderness. Normal range of motion.     Cervical back: Neck supple.  Lymphadenopathy:     Cervical: No cervical adenopathy.  Skin:    General: Skin is warm and dry.     Coloration: Skin is not pale.     Findings: No erythema or rash.  Neurological:     General: No focal deficit present.     Mental Status: He is alert and oriented to person, place, and time.     Cranial Nerves: No cranial nerve deficit.     Motor: No abnormal muscle tone.     Coordination: Coordination normal.     Deep Tendon Reflexes: Reflexes are normal and symmetric. Reflexes  normal.  Psychiatric:        Mood and Affect: Mood normal.        Behavior: Behavior normal.        Thought Content: Thought content normal.        Judgment: Judgment normal.           Assessment & Plan:  He is recovering well from lumbar spine surgery. His BP has been steady off the Atenolol , so we agreed to stop this completely. He will report back in a few weeks. Given a Prevnar 20 vaccine today. We will get labs to check lipids, etc. I personally spent a total of 32 minutes in the care of the patient today including getting/reviewing separately obtained history, performing a medically appropriate exam/evaluation, and counseling and educating.  Garnette Olmsted, MD

## 2024-08-29 NOTE — Addendum Note (Signed)
 Addended by: LADONNA INOCENTE SAILOR on: 08/29/2024 01:46 PM   Modules accepted: Orders

## 2024-08-30 ENCOUNTER — Ambulatory Visit: Payer: Self-pay | Admitting: Family Medicine

## 2024-09-09 DIAGNOSIS — S32032D Unstable burst fracture of third lumbar vertebra, subsequent encounter for fracture with routine healing: Secondary | ICD-10-CM | POA: Diagnosis not present

## 2024-09-17 DIAGNOSIS — S32032D Unstable burst fracture of third lumbar vertebra, subsequent encounter for fracture with routine healing: Secondary | ICD-10-CM | POA: Diagnosis not present

## 2024-09-24 DIAGNOSIS — S32032D Unstable burst fracture of third lumbar vertebra, subsequent encounter for fracture with routine healing: Secondary | ICD-10-CM | POA: Diagnosis not present

## 2024-10-10 ENCOUNTER — Other Ambulatory Visit (HOSPITAL_COMMUNITY): Payer: Self-pay

## 2024-10-10 DIAGNOSIS — J3489 Other specified disorders of nose and nasal sinuses: Secondary | ICD-10-CM | POA: Diagnosis not present

## 2024-10-10 DIAGNOSIS — J342 Deviated nasal septum: Secondary | ICD-10-CM | POA: Diagnosis not present

## 2024-10-10 DIAGNOSIS — J343 Hypertrophy of nasal turbinates: Secondary | ICD-10-CM | POA: Diagnosis not present

## 2024-10-10 MED ORDER — AZELASTINE HCL 137 MCG/SPRAY NA SOLN
1.0000 | Freq: Two times a day (BID) | NASAL | 11 refills | Status: AC | PRN
  Filled 2024-10-10: qty 30, 30d supply, fill #0

## 2024-10-29 DIAGNOSIS — S32032D Unstable burst fracture of third lumbar vertebra, subsequent encounter for fracture with routine healing: Secondary | ICD-10-CM | POA: Diagnosis not present
